# Patient Record
Sex: Female | Born: 1960 | Race: White | Hispanic: No | Marital: Married | Smoking: Never smoker
Health system: Southern US, Community
[De-identification: ages and names within clinical notes are randomized; demographics above are authoritative.]

## PROBLEM LIST (undated history)

## (undated) DIAGNOSIS — I00 Rheumatic fever without heart involvement: Secondary | ICD-10-CM

## (undated) HISTORY — PX: APPENDECTOMY: SHX54

## (undated) HISTORY — PX: TONSILLECTOMY: SUR1361

## (undated) HISTORY — PX: HAND SURGERY: SHX662

## (undated) HISTORY — DX: Rheumatic fever without heart involvement: I00

## (undated) HISTORY — PX: ABDOMINAL HYSTERECTOMY: SHX81

## (undated) HISTORY — PX: BACK SURGERY: SHX140

## (undated) HISTORY — PX: TUBAL LIGATION: SHX77

---

## 2006-10-22 ENCOUNTER — Other Ambulatory Visit: Admission: RE | Admit: 2006-10-22 | Discharge: 2006-10-22 | Payer: Self-pay | Admitting: Obstetrics and Gynecology

## 2007-11-29 ENCOUNTER — Other Ambulatory Visit: Admission: RE | Admit: 2007-11-29 | Discharge: 2007-11-29 | Payer: Self-pay | Admitting: Obstetrics and Gynecology

## 2007-12-05 ENCOUNTER — Ambulatory Visit (HOSPITAL_COMMUNITY): Admission: RE | Admit: 2007-12-05 | Discharge: 2007-12-05 | Payer: Self-pay | Admitting: Obstetrics and Gynecology

## 2007-12-26 ENCOUNTER — Ambulatory Visit (HOSPITAL_COMMUNITY): Admission: RE | Admit: 2007-12-26 | Discharge: 2007-12-26 | Payer: Self-pay | Admitting: Obstetrics and Gynecology

## 2007-12-26 ENCOUNTER — Encounter: Payer: Self-pay | Admitting: Obstetrics and Gynecology

## 2008-02-18 ENCOUNTER — Ambulatory Visit (HOSPITAL_COMMUNITY): Admission: RE | Admit: 2008-02-18 | Discharge: 2008-02-18 | Payer: Self-pay | Admitting: Obstetrics and Gynecology

## 2008-02-25 ENCOUNTER — Encounter: Payer: Self-pay | Admitting: Obstetrics and Gynecology

## 2008-02-25 ENCOUNTER — Ambulatory Visit (HOSPITAL_COMMUNITY): Admission: RE | Admit: 2008-02-25 | Discharge: 2008-02-27 | Payer: Self-pay | Admitting: Obstetrics and Gynecology

## 2008-11-10 ENCOUNTER — Ambulatory Visit: Payer: Self-pay | Admitting: Pain Medicine

## 2008-11-18 ENCOUNTER — Ambulatory Visit: Payer: Self-pay | Admitting: Pain Medicine

## 2008-12-22 ENCOUNTER — Ambulatory Visit: Payer: Self-pay | Admitting: Pain Medicine

## 2009-01-06 ENCOUNTER — Ambulatory Visit: Payer: Self-pay | Admitting: Pain Medicine

## 2010-01-02 IMAGING — US US TRANSVAGINAL NON-OB
1 series · 14 of 25 positions shown · non-contrast
Comparison: None

CLINICAL DATA: Endometriosis

TRANSABDOMINAL AND TRANSVAGINAL ULTRASOUND OF PELVIS
TECHNIQUE: Both transabdominal and transvaginal ultrasound
examinations of the pelvis were performed including evaluation of
the uterus, ovaries, adnexal regions, and pelvic cul-de-sac.

[Series 1: us transvaginal non-ob · 0.28mm/px · 14 of 32 slices shown]
[im 1/32]
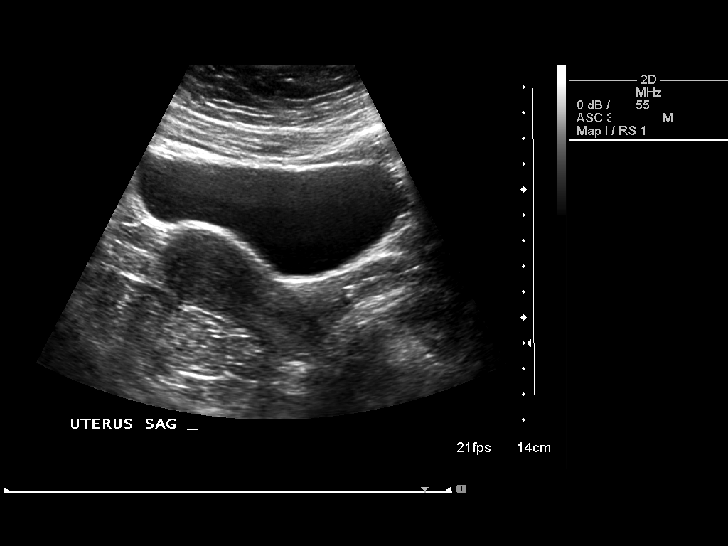
[im 3/32]
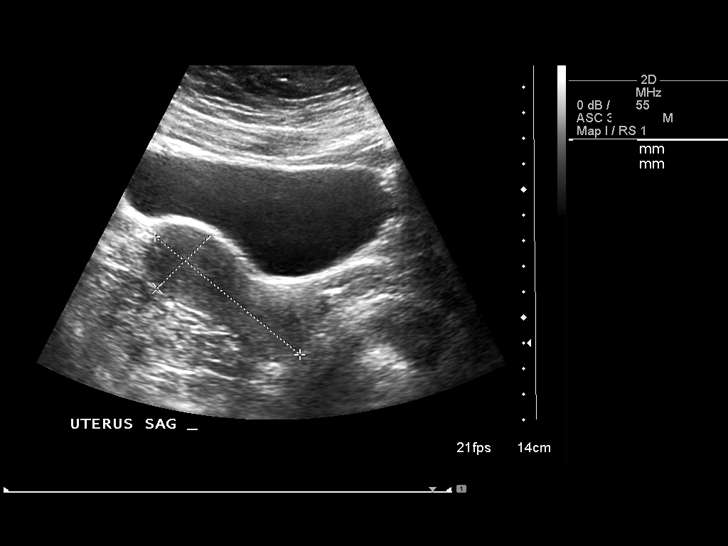
[im 6/32]
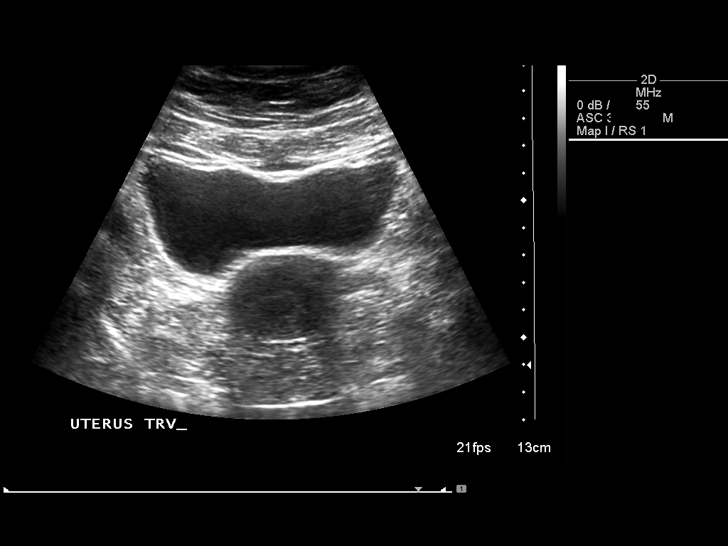
[im 8/32]
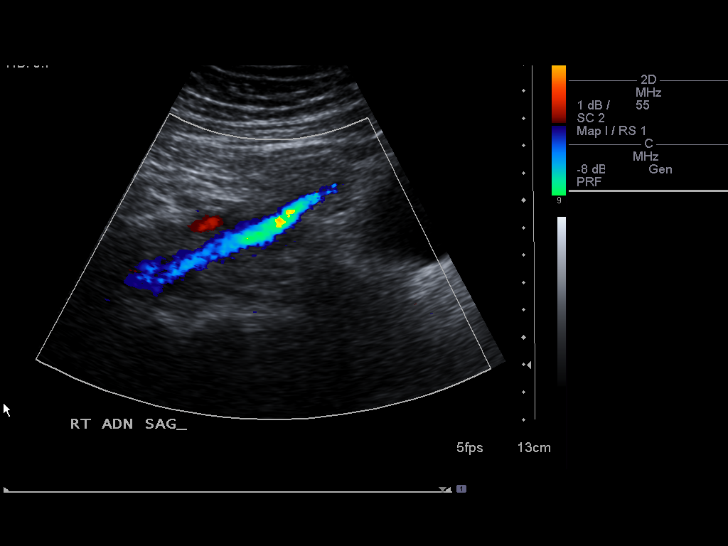
[im 11/32]
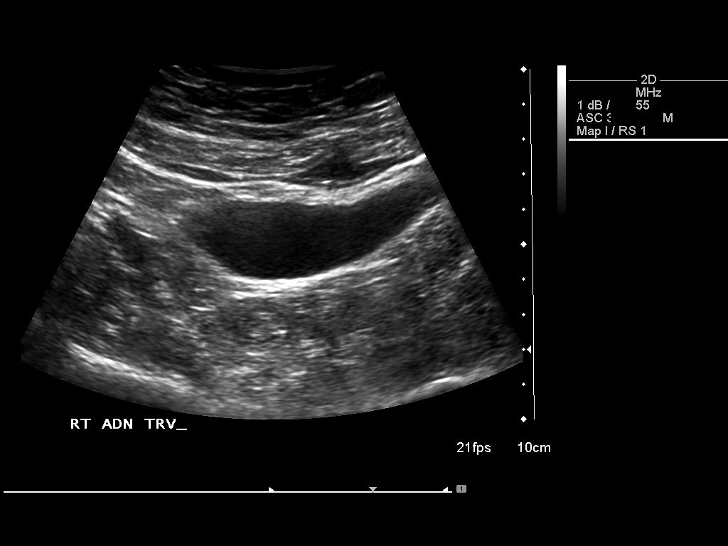
[im 12/32]
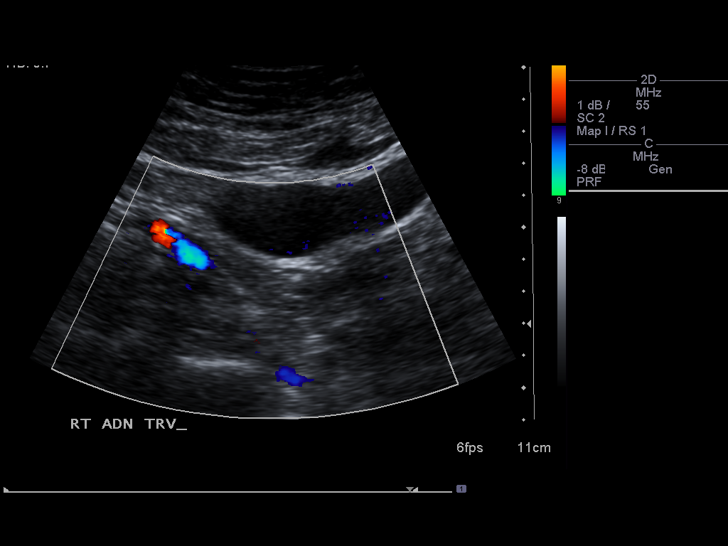
[im 15/32]
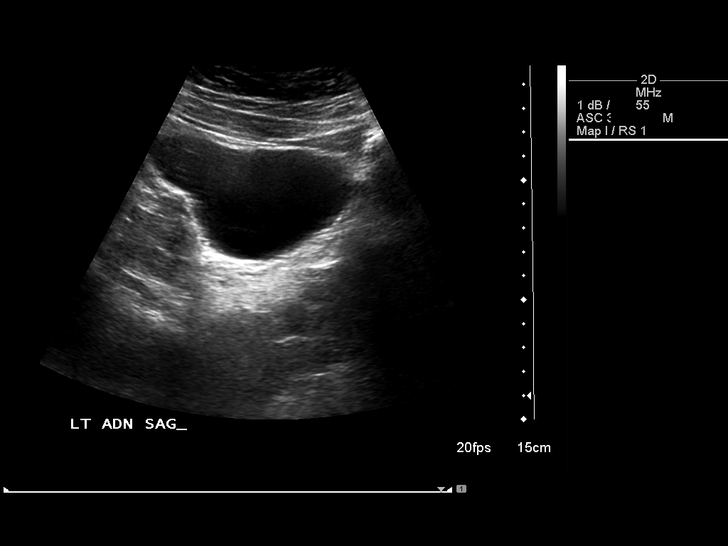
[im 17/32]
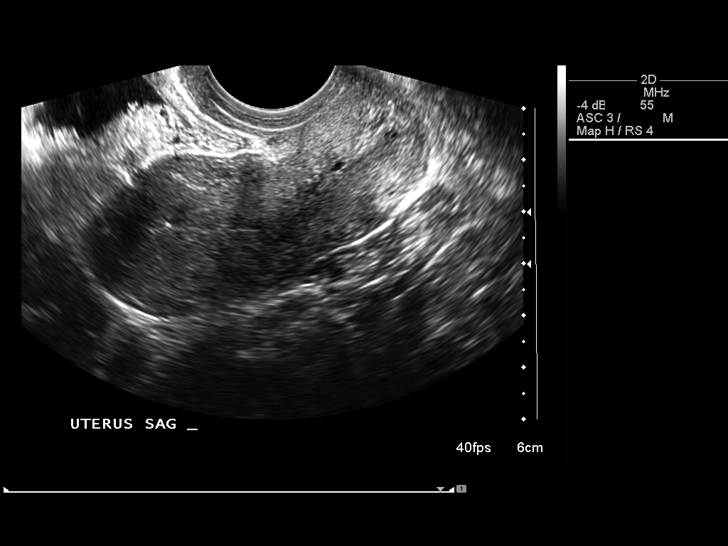
[im 20/32]
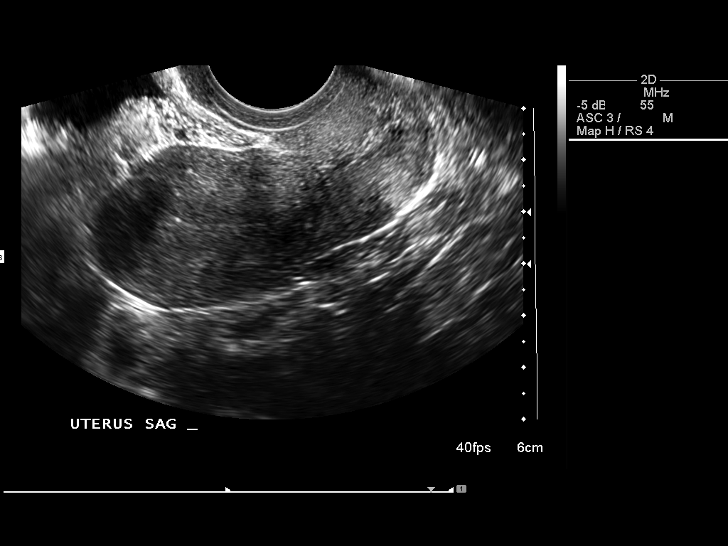
[im 21/32]
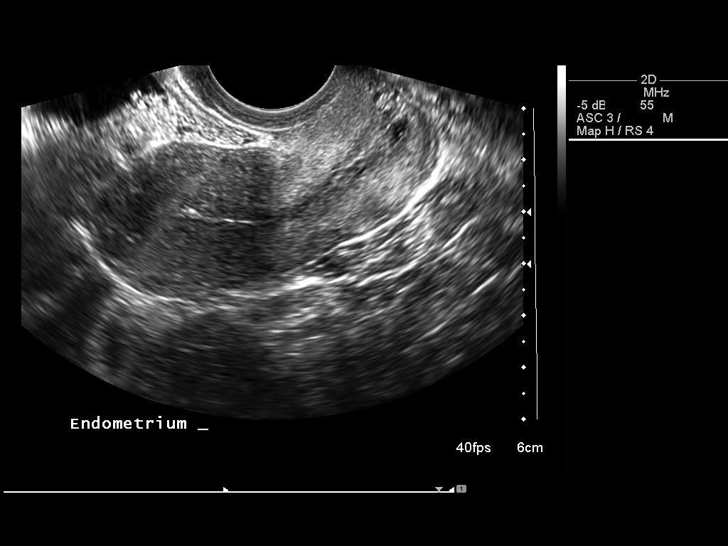
[im 24/32]
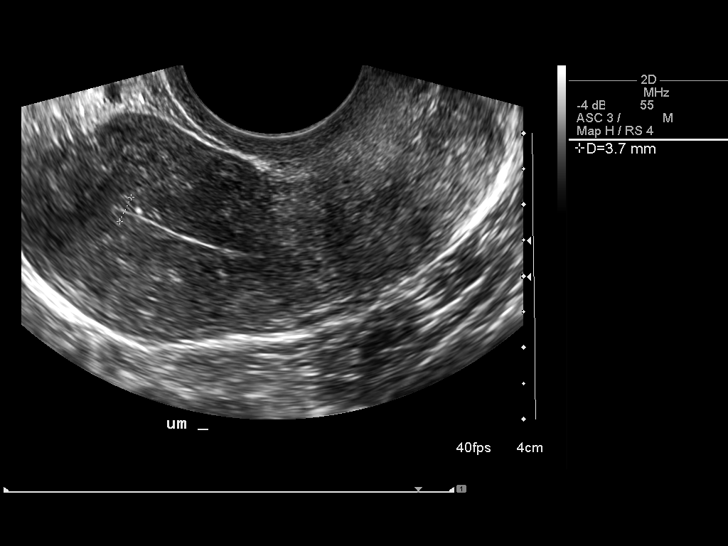
[im 26/32]
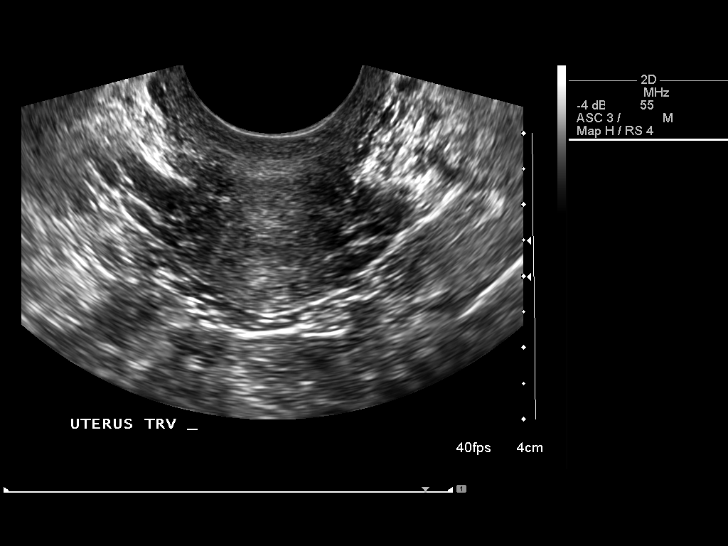
[im 29/32]
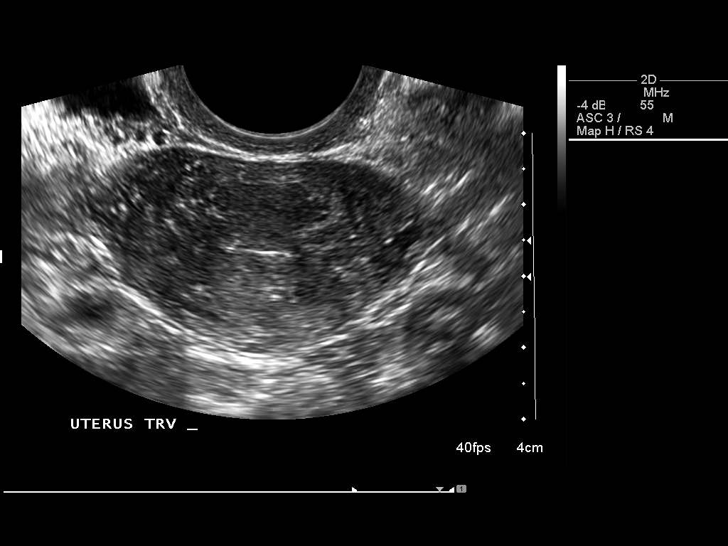
[im 32/32]
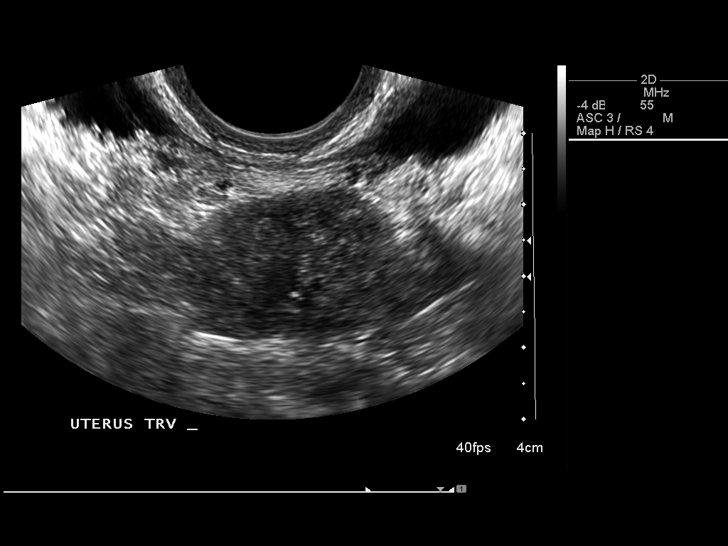

[14 of 25 positions shown; findings below may reference images not displayed]

FINDINGS: Uterus measures 7.3 cm length by 3.0 cm AP by 4.9 cm transverse.
Endometrial complex 4 mm thick, normal.
No uterine mass or endometrial fluid.
No free pelvic fluid.
Neither ovary is definitively identified on transabdominal or
endovaginal imaging.
No adnexal masses.
Visualized portion of urinary bladder unremarkable.
IMPRESSION: Nonvisualization of ovaries.
Unremarkable uterus.
No acute pelvic abnormality identified.

## 2010-05-09 LAB — COMPREHENSIVE METABOLIC PANEL
ALT: 30 U/L (ref 0–35)
AST: 28 U/L (ref 0–37)
Albumin: 3.7 g/dL (ref 3.5–5.2)
Calcium: 8.9 mg/dL (ref 8.4–10.5)
Glucose, Bld: 97 mg/dL (ref 70–99)
Total Protein: 5.9 g/dL — ABNORMAL LOW (ref 6.0–8.3)

## 2010-05-09 LAB — CBC
HCT: 37.8 % (ref 36.0–46.0)
MCHC: 34 g/dL (ref 30.0–36.0)
MCV: 90.7 fL (ref 78.0–100.0)
Platelets: 201 10*3/uL (ref 150–400)
RDW: 12.9 % (ref 11.5–15.5)
WBC: 4.5 10*3/uL (ref 4.0–10.5)

## 2010-05-09 LAB — URINALYSIS, ROUTINE W REFLEX MICROSCOPIC
Glucose, UA: NEGATIVE mg/dL
Hgb urine dipstick: NEGATIVE
Nitrite: NEGATIVE
pH: 6.5 (ref 5.0–8.0)

## 2010-05-09 LAB — HCG, QUANTITATIVE, PREGNANCY: hCG, Beta Chain, Quant, S: <2 m[IU]/mL (ref <5)

## 2010-05-09 LAB — TYPE AND SCREEN: ABO/RH(D): B POS

## 2010-05-09 LAB — URINE CULTURE: Colony Count: NO GROWTH

## 2010-05-10 LAB — DIFFERENTIAL
Basophils Absolute: 0 10*3/uL (ref 0.0–0.1)
Basophils Relative: 1 % (ref 0–1)
Eosinophils Absolute: 0.1 10*3/uL (ref 0.0–0.7)
Lymphocytes Relative: 38 % (ref 12–46)
Lymphs Abs: 3 10*3/uL (ref 0.7–4.0)
Neutro Abs: 3.8 10*3/uL (ref 1.7–7.7)

## 2010-05-10 LAB — CBC
HCT: 33.5 % — ABNORMAL LOW (ref 36.0–46.0)
Platelets: 169 10*3/uL (ref 150–400)
RBC: 3.71 MIL/uL — ABNORMAL LOW (ref 3.87–5.11)
RDW: 12.8 % (ref 11.5–15.5)

## 2010-06-07 NOTE — Op Note (Signed)
NAME:  Kaylee [name], Kaylee [name]                 ACCOUNT NO.:  [id number]   MEDICAL RECORD NO.:  [id number]          PATIENT TYPE:  OIB   LOCATION:  A305                          FACILITY:  [facility]   PHYSICIAN:  Tilda Burrow, M.D. DATE OF BIRTH:  [birth year]   DATE OF PROCEDURE:  DATE OF DISCHARGE:  02/27/2008                               OPERATIVE REPORT   PREOPERATIVE DIAGNOSIS:  Chronic pelvic pain and adenomyosis status post  failed endometrial ablation, fibromyalgia.   POSTOPERATIVE DIAGNOSIS:  Chronic pelvic pain and adenomyosis status  post failed endometrial ablation, fibromyalgia.   PROCEDURE:  Laparoscopically assisted vaginal hysterectomy.   SURGEON:  Tilda Burrow, MD   ASSISTANTAmie Critchley, CST   Anesth:General.   COMPLICATIONS:  None.   FINDINGS:  Mobile uterus, normal size, normal appearing ovaries,  bilateral evidence of ovulation stigma along the left ovary.   DETAILS OF PROCEDURE:  The patient was taken to the operating room,  prepped and draped for combined abdominal and vaginal procedure.  Time-  out was conducted.  Procedure confirmed and IV antibiotics infused.  Cervix grasped with a single-tooth tenaculum, Hulka tenaculum attached  to the cervix for uterine manipulation and Foley catheter inserted.  Latex free instruments and clothes were used.  Abdomen was then dressed  by placing an infraumbilical vertical 1 cm skin incision made as well as  a transverse suprapubic incision with Veress needle used to enter the  peritoneal cavity.  Water droplet test confirmed intraperitoneal  positioning and pneumoperitoneum was achieved under 7 mmHg pressure.  The 10-mm laparoscopic trocar was introduced via the umbilicus without  difficulty and the abdomen scanned.  No evidence of trauma or bleeding  was identified.  Attention was then directed to the pelvis.  The  suprapubic trocar was introduced under direct visualization.  There was  some omental adhesions in the right  side of the abdomen on the lateral  abdominal side wall.  No bowel adhesions were attached in this field.  Right-sided trocar was placed equal to the umbilicus, lateral to the  rectus muscles and did not involve the adhesions.  Attention was then  directed to uterus, which was easily identified and well visualized.  Bowel was moved away.  Round ligaments were taken down on either side  using the Harmonic scalpel.  The utero-ovarian ligament and fallopian  tube were then taken down on either side using Harmonic scalpel.  The  bladder flap was developed anteriorly.  Uterine vessels were  skeletonized sufficiently on either that they were then coagulated on  either side using the Harmonic scalpel with several coagulating  graspings on either side before transecting the uterine vessels.  There  was a small amount of oozing on the left side that was treated with  individual coagulation.  Hemostasis was excellent at this time.  The  laparoscopic instruments were removed and abdomen deflated and trocar  sleeves were left in place while we went to the pelvis.  Vaginal  hysterectomy portion was then performed.  With the operator sitting  between the patient's legs which were raised in high  lithotomy position  in the candy cane stirrups, the cervix was circumscribed with Bovie  cautery after infiltration with 8 mL of Marcaine solution to assist with  dissection.  Posterior colpotomy incision was performed without  difficulty.  Uterosacral ligaments were grasped, transected, and then  ligated with 0 chromic suture which was tagged.  Lower cardinal  ligaments were clamped, cut, and suture ligated on either side.  The  bladder flap liberation was completed anteriorly and bladder could be  pulled away.  Upper cardinal ligaments were then inferiorly clamped,  cut, and suture ligated on either side.  Then the small remnant of  pedicle on either side was then clamped, cut, and suture ligated in the  lower  most portions of the broad ligament.  Uterus was easily removed  and hemostasis was excellent.  The pedicles were inspected and confirmed  as hemostatic.  The uterosacral ligaments were quite far apart and 2-0  Prolene suture was then used to sew between the inner aspects of the  uterosacral ligament on either side and tied loosely to keep the  uterosacral ligaments in natural anatomic relationship and reduce  potential for future herniation.  Peritoneum was then closed from the  bladder flap to the posterior colpotomy incision site.  The anterior  vaginal cuff was then closed with a series of running 2-0 chromic  sutures.  Vaginal packing was placed and procedure considered complete.  The sponge and needle counts were correct.  The patient tolerated well.  The patient was then awakened and went to recovery room in stable  condition.   It should be mentioned that after completion of the vaginal procedure,  we moved back up to the abdomen and inspected the pedicles from above  and confirmed hemostasis, took some photos to document that everything  was satisfactory at the completion of the case and deflated the abdomen  once again, removed laparoscopic instruments, closed the umbilical cord  with 0 Vicryl at the fascia level and then subcuticular 4-0 Dexon at  both the right-sided site, suprapubic site, and umbilical site.  Steri-  Strips were placed and the patient went to recovery room in stable  condition.      Tilda Burrow, M.D.  Electronically Signed     JVF/MEDQ  D:  02/27/2008  T:  02/28/2008  Job:  [id number]

## 2010-06-07 NOTE — H&P (Signed)
NAME:  Kaylee [name], DRY                 ACCOUNT NO.:  [id number]   MEDICAL RECORD NO.:  [id number]          PATIENT TYPE:  AMB   LOCATION:  DAY                           FACILITY:  [facility]   PHYSICIAN:  Tilda Burrow, M.D. DATE OF BIRTH:  11/16/1960   DATE OF ADMISSION:  DATE OF DISCHARGE:  LH                              HISTORY & PHYSICAL   ADMITTING DIAGNOSES:  1. Dysmenorrhea.  2. Breakthrough bleeding on Lybrel.  3. Fibromyalgia.  4. Desire for elective permanent sterilization.   HISTORY OF PRESENT ILLNESS:  This 50 year old permanently disabled  female is admitted at this time for hysteroscopy, D&C, endometrial  ablation as well as laparoscopic tubal sterilization.  Kaylee [name] has been  seen in our office this year and has been evaluated for her debilitating  discomfort.  She has been placed on continuous Lybrel 90 mcg, estrogen  containing birth control pill used on a continuous method to avoid the  debilitating dysmenorrhea.  Unfortunately, she is frustrated by the  generalized malaise and sense of bloating all the time that she  associates being on the Lybrel continuously.  She has a daily sensation  of PMS with bloating abdominal ache and continuous headache while on the  Lybrel, this goes away if she is off the hormones, but the periods  become debilitating.  Pain threshold is affected by the fibromyalgia.  The patient had options discussed and has had evaluation including  ultrasound which showed a 7.3 cm long by 3 x 4.9 cm uterus with a thin 4-  mm endometrial thickness considered normal Pap smears class I.  There  are no adnexal masses.   Recent laboratory data includes hemoglobin 13, hematocrit 40, and white  count 4900.  BUN 10, creatinine 0.79.  Cholesterol level shows total  cholesterol 164, triglycerides 78, HDL 62, LDL 86.  Vitamin D levels are  actually 39 mg/mL which is quite good.   PAST MEDICAL HISTORY:  Positive for fibromyalgia with a history of  rheumatic  fever.   SURGICAL HISTORY:  [place], [place], appendectomy 1972, tonsillectomy  1980, surgery on left and right arm and hand 1998 as well as 1999 and  2000.   Allergies include PENICILLIN, SULFA DRUGS, KIDNEY DYE.   MEDICATIONS:  Lybrel 20 mcg, Topamax 100 mg p.o. as needed for  headaches, Lortab for pain management.   PHYSICAL EXAMINATION:  VITAL SIGNS:  Height 5 feet 6 inches, weight  202.4, blood pressure 120/80, pulse 70.  HEENT:  Pupils equal, round, and reactive.  Extraocular movements  intact.  NECK:  Supple.  CHEST:  Clear to auscultation.  ABDOMEN:  No gross abnormalities noted.  Bowel sounds active.  Lower  abdomen soft.  EXTERNAL GENITALIA:  Multiparous.  Uterus anteflexed.  Normal size,  shape, and contour.  Adnexa nontender without masses.   ASSESSMENT:  Dysmenorrhea, menorrhagia.   PLAN:  LVS, Falope rings as well as hysteroscopy, D&C, endometrial  ablation on December 26, 2007.      Tilda Burrow, M.D.  Electronically Signed     JVF/MEDQ  D:  12/23/2007  T:  12/24/2007  Job:  [id number]   cc:   [facility]

## 2010-06-07 NOTE — Discharge Summary (Signed)
[name], [name]                 ACCOUNT NO.:  [id number]   MEDICAL RECORD NO.:  [id number]          PATIENT TYPE:  OIB   LOCATION:  A305                          FACILITY:  [facility]   PHYSICIAN:  Tilda Burrow, M.D. DATE OF BIRTH:  1960/10/28   DATE OF ADMISSION:  02/25/2008  DATE OF DISCHARGE:  02/04/2010LH                               DISCHARGE SUMMARY   ADMITTING DIAGNOSES:  1. Chronic pelvic pain.  2. Adenomyosis.  3. Status post endometrial ablation.  4. Fibromyalgia.   DISCHARGE DIAGNOSES:  1. Chronic pelvic pain.  2. Adenomyosis.  3. Status post endometrial ablation.  4. Fibromyalgia.   PROCEDURE:  Laparoscopically-assisted vaginal hysterectomy by Tilda Burrow.   DISCHARGE MEDICATIONS:  1. Percocet 5/325, #60 one p.o. q.6 h. p.r.n. pain x2 weeks.  2. The stool softener of choice p.o. daily.   ADDITIONAL MEDICATIONS:  1. Topamax 50 mg p.o. nightly.  2. Cipro 500 p.o. twice daily to complete urinary tract infection      treatment.   HOSPITAL SUMMARY:  This is a 50 year old permanently disabled female  with fibromyalgia, is 2 months status post hysteroscopy, D and C,  endometrial ablation, was scheduled for LAVH.  This was performed  without complications on February 25, 2008 and was uneventful.  The  patient's postoperative pain management was managed  successfully with Buprenex, which gave her an excellent relief (narcotic  agonist/antagonist).  This worked more effectively than Toradol or  opioids.  The patient was stable for discharge on 2 days instead the  usual 1 day, due to her pain intolerances and will be discharged home  for routine postop care and follow up in 2 weeks.      Tilda Burrow, M.D.  Electronically Signed     JVF/MEDQ  D:  02/27/2008  T:  02/28/2008  Job:  [id number]   cc:   Dr. Gavin Pound, [place], [place]
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
## 2010-06-07 NOTE — Op Note (Signed)
NAME:  Kaylee [name], Kaylee [name]                 ACCOUNT NO.:  [id number]   MEDICAL RECORD NO.:  [id number]          PATIENT TYPE:  AMB   LOCATION:  DAY                           FACILITY:  [facility]   PHYSICIAN:  Tilda Burrow, M.D. DATE OF BIRTH:  11-23-1960   DATE OF PROCEDURE:  12/26/2007  DATE OF DISCHARGE:  12/26/2007                               OPERATIVE REPORT   PREOPERATIVE DIAGNOSES:  Dysmenorrhea, desired for electrosterilization,  fibromyalgia with severe pain with menses.   POSTOPERATIVE DIAGNOSES:  Dysmenorrhea, desired for  electrosterilization, fibromyalgia with severe pain with menses.   PROCEDURE:  1 . Hysteroscopy, dilatation and curettage, endometrial  ablation,  1. laparoscopic tubal sterilization, and fallopian rings.   INDICATIONS:  A 50 year old disabled female with incapacitating pain  over periods with schedule for hysteroscopy, D&C, and endometrial  ablation with plan for permanent sterilization, same time.  She had been  on Lybrel continuous OCP, but has side-effects and cannot tolerate them.   DETAILS OF THE PROCEDURE:  The patient was taken to the operating room  prepped and draped for a combined abdominal and vaginal procedure with  cervix grasped with a single-tooth tenaculum, in and out catheterization  of the bladder performed and attention was directed to the abdomen.  Time out had been conducted and procedure confirmed by all parties.  An  infraumbilical vertical 1-cm skin incision is made as well as a  transverse suprapubic incision.  Veress needle was introduced through  the umbilicus being carefully oriented toward the pelvis while elevating  the abdominal wall.  Water droplet test confirmed intraperitoneal  positioning.  The pneumoperitoneum was performed under normal pressures  and laparoscopic visualization of the umbilical 5-mm trocar incision was  performed.  Inspection of the abdomen showed no evidence of trauma or  bleeding associated with  insertion technique and a dense suprapubic  trocar of 8 mm was placed without difficulty under direct visualization.  Attention was directed to the pelvis and each fallopian tube was  identified to its fimbriated end, elevated, and the mid segment  microtube incarcerated in a Falope ring and the incarcerated microtube  infiltrated with dilute Marcaine solution by percutaneous spinal needle  placement.  Both the mesosalpinx was infiltrated with Marcaine solution  as well.  Cul-de-sac was inspected.  But there were no GYN pathology and  pelvis showed good support.  Ovaries were normal and mobile.  Attention  was directed to the deflation of the abdomen with 120 mL of saline  placed in the abdomen to help with removal of CO2 and abdomen was  deflated.  Laparoscopic instruments removed.  Trocars were removed and  the incisions closed subcuticular with 4-0 Dexon.  Steri-Strips were  placed and umbilical portion of the procedure considered complete.   Endometrial ablation followed.  After repositioning at the vaginal  entrance, speculum was inserted.  Cervix was grasped with a single-tooth  tenaculum was sounded to 7 cm, dilated to 23-French, allowing the  introduction of a 30-degree rigid hysteroscope which showed a thinned  endometrial cavity with both tubal ostia identified.  The Gynecare  Thermachoice III endometrial ablation device was then prepped and tested  and inserted into the endometrial cavity, filled with 9 mL of D5W and  thermal 8 minute ablation sequence initiated.  The uterus have the  tendency relax but maintain pressure at just above 100 mmHg which will  add completion of the 8 minute time  sequence.  All 9 mL fluid was obtained back and was accounted for at the  end of the end of the procedure.  Paracervical Marcaine solution was  injected x10 mL on each side to complete effects of analgesia.  The  patient went to the recovery room in stable conditions.  Sponge, lap,  and  needle counts were correct.      Tilda Burrow, M.D.  Electronically Signed     JVF/MEDQ  D:  12/27/2007  T:  12/28/2007  Job:  [id number]

## 2010-10-28 LAB — CBC
HCT: 39.5 % (ref 36.0–46.0)
Hemoglobin: 13.3 g/dL (ref 12.0–15.0)
MCV: 91.3 fL (ref 78.0–100.0)
RBC: 4.32 MIL/uL (ref 3.87–5.11)
WBC: 6.3 10*3/uL (ref 4.0–10.5)

## 2013-01-23 HISTORY — PX: COLONOSCOPY: SHX174

## 2014-11-30 ENCOUNTER — Encounter: Payer: Self-pay | Admitting: Gastroenterology

## 2014-12-11 DIAGNOSIS — R002 Palpitations: Secondary | ICD-10-CM

## 2014-12-11 DIAGNOSIS — J301 Allergic rhinitis due to pollen: Secondary | ICD-10-CM | POA: Insufficient documentation

## 2014-12-11 DIAGNOSIS — I00 Rheumatic fever without heart involvement: Secondary | ICD-10-CM

## 2014-12-11 DIAGNOSIS — B019 Varicella without complication: Secondary | ICD-10-CM

## 2014-12-14 ENCOUNTER — Ambulatory Visit (HOSPITAL_COMMUNITY)
Admission: RE | Admit: 2014-12-14 | Discharge: 2014-12-14 | Disposition: A | Discharge status: Home / Self Care | Payer: Medicare HMO | Source: Ambulatory Visit | Attending: Internal Medicine | Admitting: Internal Medicine

## 2014-12-14 ENCOUNTER — Encounter: Payer: Self-pay | Admitting: Internal Medicine

## 2014-12-14 ENCOUNTER — Ambulatory Visit (INDEPENDENT_AMBULATORY_CARE_PROVIDER_SITE_OTHER): Payer: Medicare HMO | Admitting: Internal Medicine

## 2014-12-14 VITALS — BP 116/74 | HR 65 | Ht 67.0 in | Wt 209.0 lb

## 2014-12-14 DIAGNOSIS — I351 Nonrheumatic aortic (valve) insufficiency: Secondary | ICD-10-CM | POA: Diagnosis not present

## 2014-12-14 DIAGNOSIS — Z136 Encounter for screening for cardiovascular disorders: Secondary | ICD-10-CM | POA: Diagnosis not present

## 2014-12-14 DIAGNOSIS — R001 Bradycardia, unspecified: Secondary | ICD-10-CM

## 2014-12-14 DIAGNOSIS — R002 Palpitations: Secondary | ICD-10-CM

## 2014-12-14 NOTE — Progress Notes (Signed)
Cardiology Office Note   Date:  12/14/2014   ID:  Kaylee, [name] 03/22/1960, MRN [id number]  PCP:  [facility]  Cardiologist:   Dorris Carnes, MD   No chief complaint on file.  Referred for eval of palpitations      History of Present Illness: Kaylee [name] is a 54 y.o. female with a history of palpitations.  Has hasd since 08-May-2012 Daughter died of overdose at that time   Pt says that she has a heart murmur with leaking valve  Echo remote  Breathing OK  Has heart wiggling  At night  Lots of nights  Increased with fatigue and stress  Lasts seconds to hours  No dizziness   Now has punching sensation in chest  Not often  Lasts seconds    Pt active   Goes to [employer] water 3x per wk  [place] small dog .  No CP       Current Outpatient Prescriptions  Medication Sig Dispense Refill  . cetirizine (ZYRTEC) 5 MG tablet Take by mouth.    Marland Kitchen HYDROcodone-acetaminophen (NORCO) 10-325 MG tablet Take 1 tablet by mouth.    . Melatonin (MELATONIN MAXIMUM STRENGTH) 5 MG TABS Take by mouth.    . Multiple Vitamins-Minerals (PRESERVISION AREDS) CAPS Take 1 capsule by mouth 2 (two) times daily.    . Omega-3 Fatty Acids (FISH OIL) 1000 MG CAPS Take 1,000 mg by mouth 3 (three) times daily.     No current facility-administered medications for this visit.    Allergies:   Penicillins; Sulfa antibiotics; and Iodine   PMH  Rheumatic fever.    PSH  Appy.  Tonsillectomy.  Hand surgery  Arm surgery.  Back surgery     Social History:  The patient  reports that she has never smoked. She does not have any smokeless tobacco history on file.   Family History:  The patient's family history is not on file.    ROS:  Please see the history of present illness. All other systems are reviewed and  Negative to the above problem except as noted.    PHYSICAL EXAM: VS:  BP 116/74 mmHg  Pulse 65  Ht 5\' 7"  (1.702 m)  Wt 94.802 kg (209 lb)  BMI 32.73 kg/m2  SpO2 97%  GEN: Well  nourished, well developed, in no acute distress HEENT: normal Neck: no JVD, carotid bruits, or masses Cardiac: RRR; no murmurs, rubs, or gallops,no edema  Respiratory:  clear to auscultation bilaterally, normal work of breathing GI: soft, nontender, nondistended, + BS  No hepatomegaly  MS: no deformity Moving all extremities   Skin: warm and dry, no rash Neuro:  Strength and sensation are intact Psych: euthymic mood, full affect   EKG:  EKG is ordered today.  Sinus bradycardia  53 bpm     Lipid Panel No results found for: CHOL, TRIG, HDL, CHOLHDL, VLDL, LDLCALC, LDLDIRECT    Wt Readings from Last 3 Encounters:  12/14/14 94.802 kg (209 lb)      ASSESSMENT AND PLAN:  1.  Palpitations  Pt has multiple sensations  None sound hemodynamically destabilizing  Active   I would recomm echo to evaluate LV function    2.  Hx murmur  None on exam  Get echo to      Signed, Dorris Carnes, MD  12/14/2014 2:02 PM    [address], [place], [place]  [address] Phone: [phone])  [phone]; Fax: [phone]

## 2014-12-14 NOTE — Progress Notes (Signed)
*  PRELIMINARY RESULTS* Echocardiogram 2D Echocardiogram has been performed.  Leavy Cella 12/14/2014, 3:46 PM

## 2014-12-14 NOTE — Patient Instructions (Signed)
Your physician recommends that you schedule a follow-up appointment  As needed  Your physician has requested that you have an echocardiogram. Echocardiography is a painless test that uses sound waves to create images of your heart. It provides your doctor with information about the size and shape of your heart and how well your heart's chambers and valves are working. This procedure takes approximately one hour. There are no restrictions for this procedure.   Your physician recommends that you continue on your current medications as directed. Please refer to the Current Medication list given to you today.  If you need a refill on your cardiac medications before your next appointment, please call your pharmacy.  Thank you for choosing [facility]!

## 2014-12-16 ENCOUNTER — Encounter: Payer: Self-pay | Admitting: Internal Medicine

## 2014-12-16 ENCOUNTER — Telehealth: Payer: Self-pay | Admitting: *Deleted

## 2014-12-16 NOTE — Telephone Encounter (Signed)
Called Pt to inform of event monitor. Pt states that when she spoke with Dr. Harrington Challenger it was decided that she would wait until after seeing GI before doing event monitor. Pt states that she will call us after her GI visit.

## 2014-12-16 NOTE — Telephone Encounter (Signed)
-----   Message from Bernita Raisin, RN sent at 12/16/2014 10:54 AM EST ----- Dr Harrington Challenger  ----- Message -----    From: Fay Records, MD    Sent: 12/16/2014  10:28 AM      To: Bernita Raisin, RN  Called pt re echo.  OK   Mild Still having palpitations mainly at night  Also having intermitt punching sensation in chest   Had one right after echo exam Will set up for event monitor

## 2014-12-23 ENCOUNTER — Ambulatory Visit: Payer: Medicare HMO | Admitting: Gastroenterology

## 2014-12-28 ENCOUNTER — Ambulatory Visit (INDEPENDENT_AMBULATORY_CARE_PROVIDER_SITE_OTHER): Payer: Medicare HMO | Admitting: Gastroenterology

## 2014-12-28 ENCOUNTER — Encounter: Payer: Self-pay | Admitting: Gastroenterology

## 2014-12-28 VITALS — BP 119/78 | HR 63 | Temp 97.2°F | Ht 67.0 in | Wt 208.4 lb

## 2014-12-28 DIAGNOSIS — K5900 Constipation, unspecified: Secondary | ICD-10-CM | POA: Insufficient documentation

## 2014-12-28 DIAGNOSIS — R198 Other specified symptoms and signs involving the digestive system and abdomen: Secondary | ICD-10-CM | POA: Insufficient documentation

## 2014-12-28 DIAGNOSIS — R1011 Right upper quadrant pain: Secondary | ICD-10-CM | POA: Diagnosis not present

## 2014-12-28 LAB — COMPREHENSIVE METABOLIC PANEL
ALBUMIN: 4.5 g/dL (ref 3.6–5.1)
ALK PHOS: 73 U/L (ref 33–130)
ALT: 22 U/L (ref 6–29)
AST: 22 U/L (ref 10–35)
BILIRUBIN TOTAL: 0.5 mg/dL (ref 0.2–1.2)
BUN: 7 mg/dL (ref 7–25)
CO2: 30 mmol/L (ref 20–31)
CREATININE: 0.61 mg/dL (ref 0.50–1.05)
Calcium: 9.3 mg/dL (ref 8.6–10.4)
Chloride: 101 mmol/L (ref 98–110)
Glucose, Bld: 94 mg/dL (ref 65–99)
Potassium: 4.8 mmol/L (ref 3.5–5.3)
SODIUM: 139 mmol/L (ref 135–146)
TOTAL PROTEIN: 6.8 g/dL (ref 6.1–8.1)

## 2014-12-28 LAB — LIPASE: LIPASE: 23 U/L (ref 7–60)

## 2014-12-28 NOTE — Assessment & Plan Note (Signed)
54 y/o female with recent acute on chronic right upper quadrant discomfort associated with certain foods but no nausea or vomiting. Over the past week symptoms have been improved with dietary changes. On exam she has notable tenderness along the right lower rib cage margin which she describes as her chronic pain but clearly has some symptoms more related to food intake. She describes having a negative gallbladder workup. She denies typical heartburn symptoms. Denies NSAID use. Increased symptoms with bread intake. Bowel habits irregular. Will screen for celiac disease, need to consider musculoskeletal pain as contributor to her symptoms, cannot exclude gastritis/duodenitis.  Initially would like to obtain copy of her ultrasound and/or HIDA results, colonoscopy report. We will obtain some basic labs along with screening for celiac. Patient hesitant to starting medication without workup. She will only need trial of NSAIDs for musculoskeletal pain. Further recommendations to follow.

## 2014-12-28 NOTE — Progress Notes (Signed)
cc'ed to pcp °

## 2014-12-28 NOTE — Patient Instructions (Signed)
1. Please have your labs done.  2. We will request copy of your records. 3. Further recommendations to follow.
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
## 2014-12-29 LAB — TISSUE TRANSGLUTAMINASE, IGA: TISSUE TRANSGLUTAMINASE AB, IGA: 1 U/mL (ref <4)

## 2014-12-30 ENCOUNTER — Encounter: Payer: Self-pay | Admitting: Gastroenterology

## 2014-12-30 NOTE — Progress Notes (Addendum)
Reviewed records. August 2015 colonoscopy by Dr. Barnabas Lister Spainhour, propofol, normal colonoscopy.   HIDA scan August 2015 normal with gallbladder ejection fraction of 59%.

## 2014-12-30 NOTE — Progress Notes (Signed)
Quick Note:  Please let patient know her labs are normal (LFTs, negative celiac screen, pancreas). I reviewed her records from [facility].  I would recommend EGD to further evaluate her ruq pain that is associated with meals. If she is agreeable, please schedule. Has she ever had difficult staying asleep for her colonoscopy, etc? If not, phenergan 25mg  IV 30 minutes before procedure to augment conscious sedation. ______

## 2015-01-01 ENCOUNTER — Telehealth: Payer: Self-pay | Admitting: *Deleted

## 2015-01-01 NOTE — Telephone Encounter (Signed)
Spoke with patient regarding event monitor. Pt states she does not want to do the event monitor. Pt states that GI is running a lot of test on her and she will call us if she changes her mind.

## 2015-01-04 ENCOUNTER — Ambulatory Visit: Payer: Medicare HMO | Admitting: Cardiovascular Disease

## 2015-01-07 ENCOUNTER — Encounter: Payer: Self-pay | Admitting: Internal Medicine

## 2015-01-13 NOTE — Progress Notes (Signed)
Quick Note:    Noted    ______

## 2015-10-04 ENCOUNTER — Other Ambulatory Visit (HOSPITAL_COMMUNITY): Payer: Self-pay | Admitting: Emergency Medicine

## 2015-10-04 DIAGNOSIS — N3941 Urge incontinence: Secondary | ICD-10-CM

## 2015-10-04 DIAGNOSIS — N8110 Cystocele, unspecified: Secondary | ICD-10-CM

## 2015-10-08 ENCOUNTER — Ambulatory Visit (HOSPITAL_COMMUNITY)
Admission: RE | Admit: 2015-10-08 | Discharge: 2015-10-08 | Disposition: A | Discharge status: Home / Self Care | Payer: Medicare HMO | Source: Ambulatory Visit | Attending: Emergency Medicine | Admitting: Emergency Medicine

## 2015-10-08 DIAGNOSIS — N8110 Cystocele, unspecified: Secondary | ICD-10-CM | POA: Diagnosis present

## 2015-10-08 DIAGNOSIS — N3941 Urge incontinence: Secondary | ICD-10-CM | POA: Diagnosis not present

## 2017-11-05 IMAGING — US US PELVIS LIMITED
1 series · 14 of 18 positions shown · non-contrast
Comparison: None.

CLINICAL DATA: Urge incontinence, bladder prolapse in female

EXAM:
LIMITED ULTRASOUND OF PELVIS
TECHNIQUE: Limited transabdominal ultrasound examination of the pelvis was
performed to assess the urinary bladder.

[Series 1: us pelvis limited · 0.24mm/px · 14 of 18 slices shown]
[im 1/18]
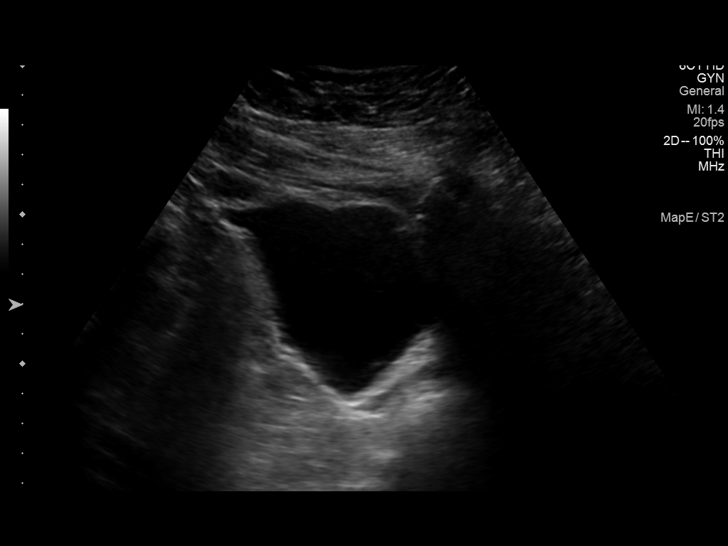
[im 2/18]
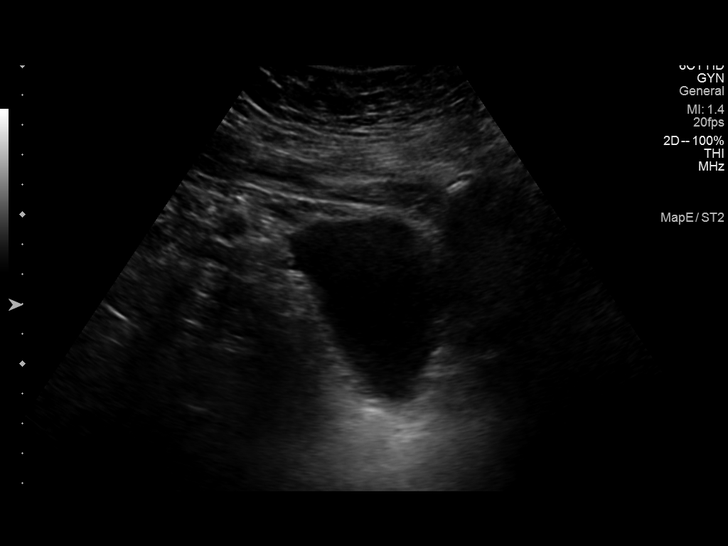
[im 4/18]
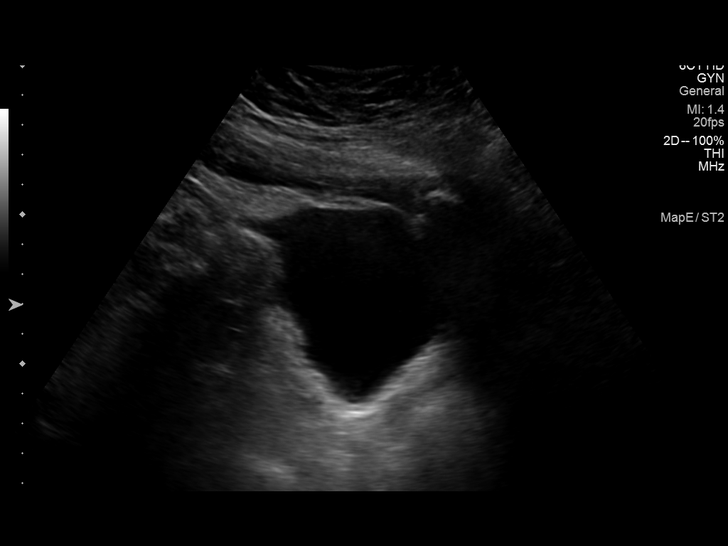
[im 5/18]
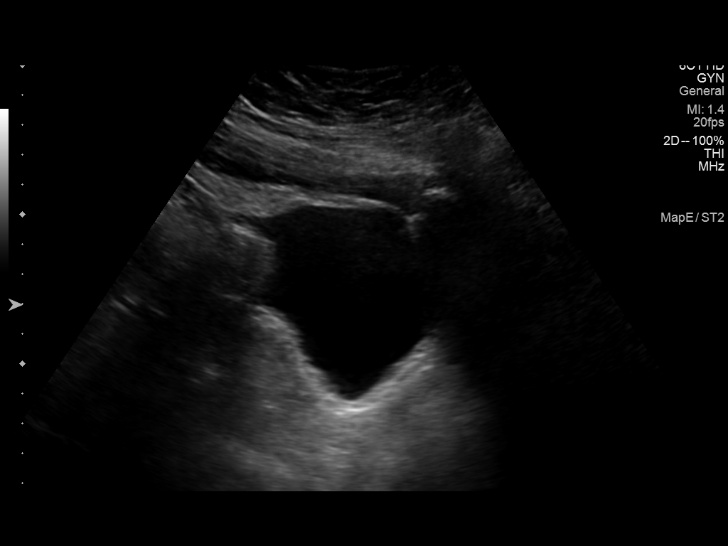
[im 6/18]
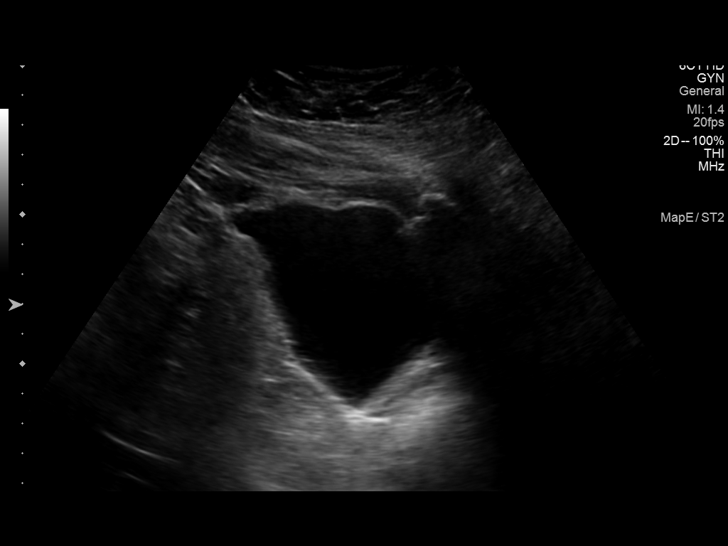
[im 8/18]
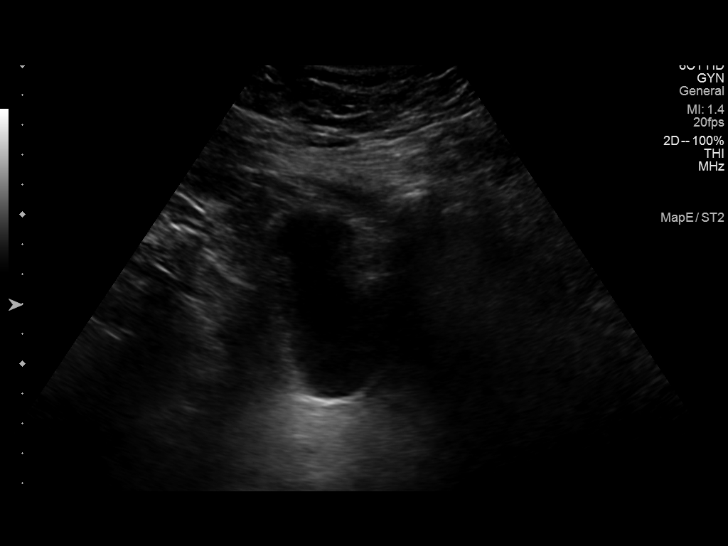
[im 9/18]
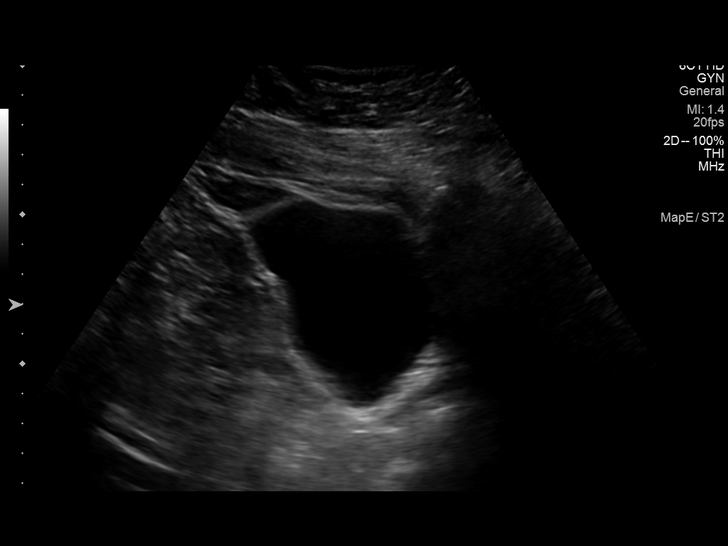
[im 10/18]
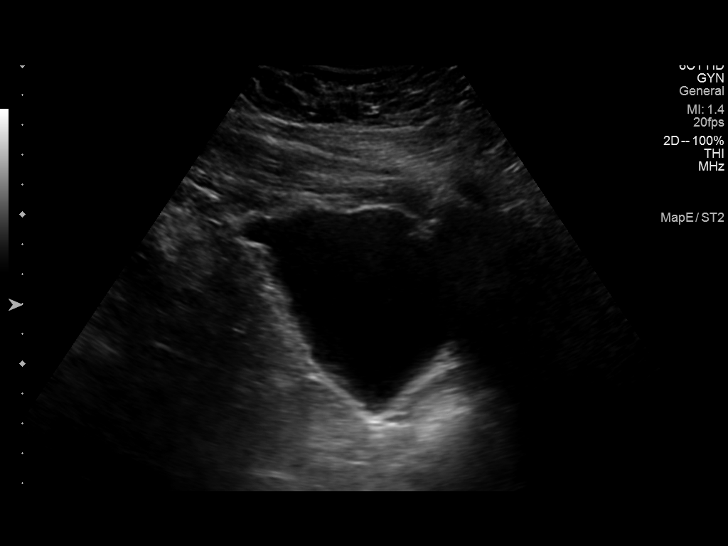
[im 11/18]
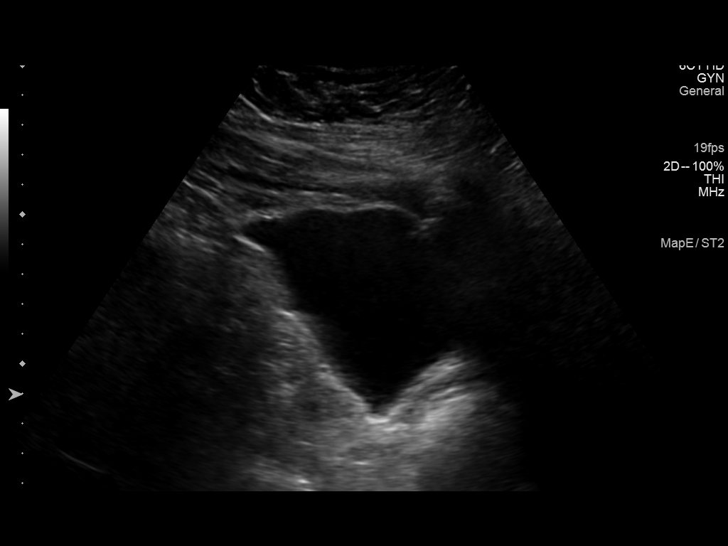
[im 13/18]
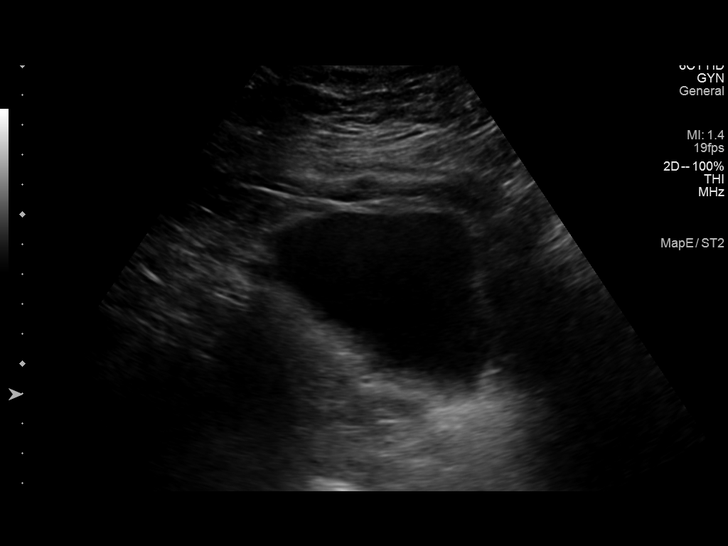
[im 14/18]
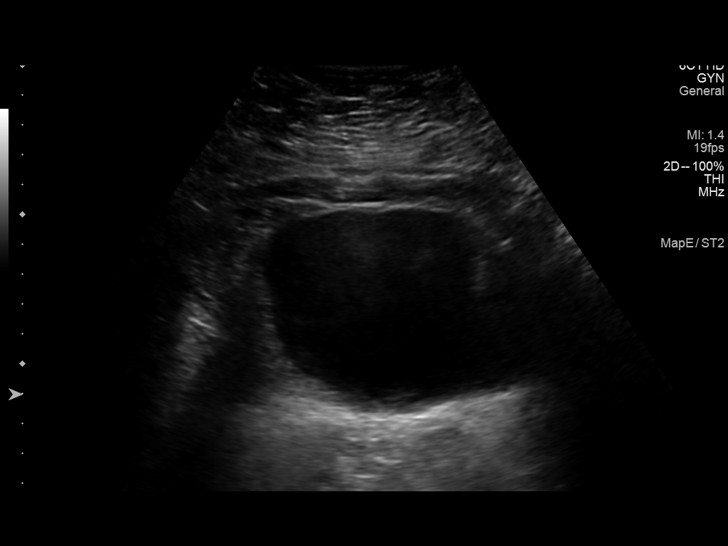
[im 15/18]
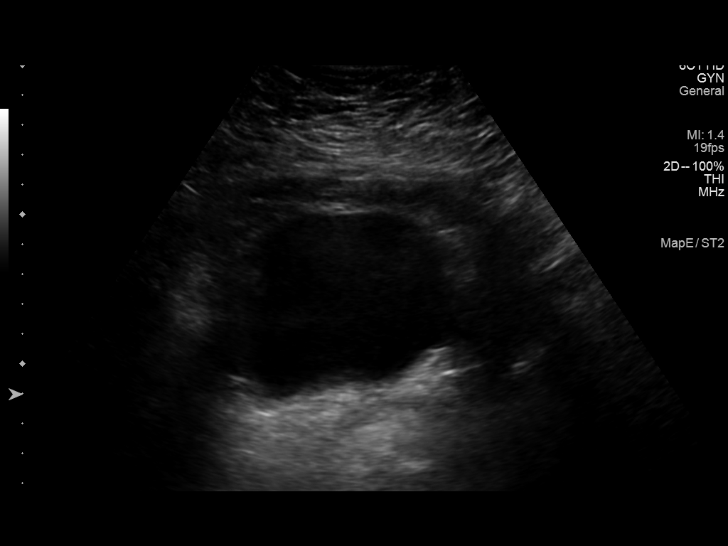
[im 17/18]
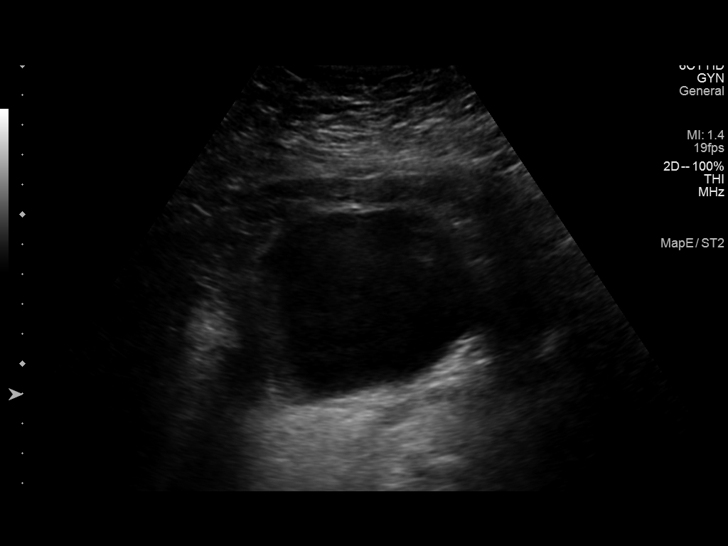
[im 18/18]
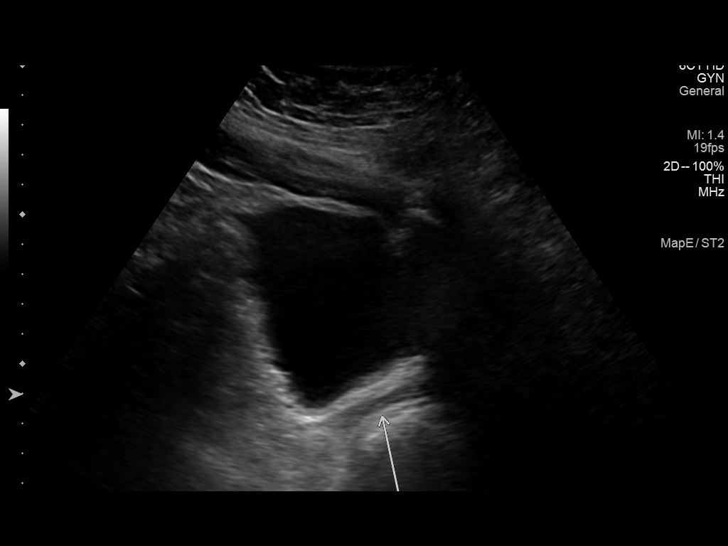

[14 of 18 positions shown; findings below may reference images not displayed]

FINDINGS: Urinary bladder distends normally.

No bladder mass or wall thickening identified.

No shadowing calculi.

Inferior margin urinary bladder appears normal without visualized
abnormal descent in the pelvis.
IMPRESSION: Unremarkable bladder ultrasound.

## 2018-03-25 ENCOUNTER — Telehealth: Payer: Self-pay

## 2018-03-25 NOTE — Telephone Encounter (Signed)
NOTES ON FILE 

## 2018-04-18 ENCOUNTER — Telehealth: Payer: Self-pay

## 2018-04-18 NOTE — Telephone Encounter (Signed)
I called patient to reschedule her 04/30/18 apt with Dr.Koneswaran  I offered her a tele apt, or a webex virtual apt, or to simply reschedule. Patient declined all. Patient stated "I'm ok, go help the sick people"  I inquired if she was still having CP and she said no.I asked her to call us back if she changes her mind.         Cardiac Questionnaire:    Since your last visit or hospitalization:    1. Have you been having new or worsening chest pain? No no   2. Have you been having new or worsening shortness of breath? No  3. Have you been having new or worsening leg swelling, wt gain, or increase in abdominal girth (pants fitting more tightly)? No   4. Have you had any passing out spells? No

## 2018-04-30 ENCOUNTER — Ambulatory Visit: Payer: Medicare HMO | Admitting: Cardiovascular Disease

## 2018-10-14 ENCOUNTER — Encounter: Payer: Self-pay | Admitting: Cardiovascular Disease

## 2018-10-14 ENCOUNTER — Ambulatory Visit (INDEPENDENT_AMBULATORY_CARE_PROVIDER_SITE_OTHER): Payer: Medicare HMO | Admitting: Cardiovascular Disease

## 2018-10-14 ENCOUNTER — Other Ambulatory Visit: Payer: Self-pay

## 2018-10-14 VITALS — BP 114/81 | HR 58 | Temp 97.1°F | Ht 67.0 in | Wt 175.0 lb

## 2018-10-14 DIAGNOSIS — I351 Nonrheumatic aortic (valve) insufficiency: Secondary | ICD-10-CM

## 2018-10-14 DIAGNOSIS — R002 Palpitations: Secondary | ICD-10-CM

## 2018-10-14 DIAGNOSIS — R079 Chest pain, unspecified: Secondary | ICD-10-CM

## 2018-10-14 NOTE — Patient Instructions (Signed)
Medication Instructions: Your physician recommends that you continue on your current medications as directed. Please refer to the Current Medication list given to you today.   Labwork: None today  Procedures/Testing: Your physician has requested that you have an echocardiogram. Echocardiography is a painless test that uses sound waves to create images of your heart. It provides your doctor with information about the size and shape of your heart and how well your heart's chambers and valves are working. This procedure takes approximately one hour. There are no restrictions for this procedure.    Follow-Up: 3 months with Dr.Koneswaran  Any Additional Special Instructions Will Be Listed Below (If Applicable).     If you need a refill on your cardiac medications before your next appointment, please call your pharmacy.        Thank you for choosing [name] !
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
## 2018-10-30 ENCOUNTER — Ambulatory Visit (HOSPITAL_COMMUNITY)
Admission: RE | Admit: 2018-10-30 | Discharge: 2018-10-30 | Disposition: A | Discharge status: Home / Self Care | Payer: Medicare HMO | Source: Ambulatory Visit | Attending: Cardiovascular Disease | Admitting: Cardiovascular Disease

## 2018-10-30 ENCOUNTER — Other Ambulatory Visit: Payer: Self-pay

## 2018-10-30 ENCOUNTER — Telehealth: Payer: Self-pay

## 2018-10-30 DIAGNOSIS — I351 Nonrheumatic aortic (valve) insufficiency: Secondary | ICD-10-CM | POA: Insufficient documentation

## 2018-10-30 NOTE — Progress Notes (Signed)
*  PRELIMINARY RESULTS* Echocardiogram 2D Echocardiogram has been performed.  Kaylee [name] 10/30/2018, 1:49 PM

## 2018-10-30 NOTE — Telephone Encounter (Signed)
-----   Message from Herminio Commons, MD sent at 10/30/2018  2:15 PM EDT ----- Normal pumping function. Some stiffness with heart relaxation. Mild aortic valve leakage, stable since prior study.

## 2018-10-30 NOTE — Telephone Encounter (Signed)
Pt made aware. Voiced understanding. Copy to pcp.  

## 2019-06-12 ENCOUNTER — Ambulatory Visit: Payer: Medicare HMO | Admitting: Gastroenterology

## 2021-03-29 ENCOUNTER — Ambulatory Visit: Payer: Medicare HMO | Admitting: Internal Medicine

## 2021-08-12 NOTE — Progress Notes (Signed)
CARDIOLOGY CONSULT NOTE       Patient ID: Kaylee [name] MRN: [id number] DOB/AGE: 1961/01/22 61 y.o.  Admit date: (Not on file) Referring Physician: Bartolo Darter  Primary Physician: [facility] Primary Cardiologist: New Previous patient Dr Jacinta Shoe 2020 Reason for Consultation: Palpitations/chest pain  Active Problems:   * No active hospital problems. *   HPI:  61 y.o. seen at request of [facility] for chest pain and palpitations Previously seen by Dr Raliegh Ip for same She has had atypical chest pain and benign palpitations TTE 10/30/18 EF 60-65% mild AR and trace MR  She is on no cardiac meds  She has not had any stress testing or monitor   ***  ROS All other systems reviewed and negative except as noted above  Past Medical History:  Diagnosis Date  . Rheumatic fever     Family History  Problem Relation Age of Onset  . Diabetes Mother   . Stroke Mother   . Stroke Father   . Cancer Brother   . Colon cancer Neg Hx     Social History   Socioeconomic History  . Marital status: Married    Spouse name: Not on file  . Number of children: Not on file  . Years of education: Not on file  . Highest education level: Not on file  Occupational History  . Not on file  Tobacco Use  . Smoking status: Never  . Smokeless tobacco: Never  Substance and Sexual Activity  . Alcohol use: No    Alcohol/week: 0.0 standard drinks of alcohol    Comment: rare  . Drug use: No  . Sexual activity: Not on file  Other Topics Concern  . Not on file  Social History Narrative  . Not on file   Social Determinants of Health   Financial Resource Strain: Not on file  Food Insecurity: Not on file  Transportation Needs: Not on file  Physical Activity: Not on file  Stress: Not on file  Social Connections: Not on file  Intimate Partner Violence: Not on file    Past Surgical History:  Procedure Laterality Date  . ABDOMINAL HYSTERECTOMY    . APPENDECTOMY    .  BACK SURGERY    . COLONOSCOPY  2015   Dr. West Carbo: normal  . HAND SURGERY    . TONSILLECTOMY    . TUBAL LIGATION        Current Outpatient Medications:  .  cetirizine (ZYRTEC) 5 MG tablet, Take by mouth., Disp: , Rfl:  .  HYDROcodone-acetaminophen (NORCO) 10-325 MG tablet, Take 1 tablet by mouth., Disp: , Rfl:  .  Melatonin (MELATONIN MAXIMUM STRENGTH) 5 MG TABS, Take by mouth., Disp: , Rfl:  .  Multiple Vitamins-Minerals (PRESERVISION AREDS) CAPS, Take 1 capsule by mouth 2 (two) times daily., Disp: , Rfl:  .  Omega-3 Fatty Acids (FISH OIL) 1000 MG CAPS, Take 1,000 mg by mouth 3 (three) times daily., Disp: , Rfl:  .  psyllium (REGULOID) 0.52 g capsule, Take 0.52 g by mouth daily., Disp: , Rfl:  .  VITAMIN D, ERGOCALCIFEROL, PO, Take by mouth., Disp: , Rfl:     Physical Exam: There were no vitals taken for this visit.    Affect appropriate Healthy:  appears stated age 23: normal Neck supple with no adenopathy JVP normal no bruits no thyromegaly Lungs clear with no wheezing and good diaphragmatic motion Heart:  S1/S2 no murmur, no rub, gallop or click PMI normal Abdomen: benighn,  BS positve, no tenderness, no AAA no bruit.  No HSM or HJR Distal pulses intact with no bruits No edema Neuro non-focal Skin warm and dry No muscular weakness   Labs:   Lab Results  Component Value Date   WBC 7.7 02/26/2008   HGB 11.1 (L) 02/26/2008   HCT 33.5 (L) 02/26/2008   MCV 90.4 02/26/2008   PLT 169 02/26/2008   No results for input(s): "NA", "K", "CL", "CO2", "BUN", "CREATININE", "CALCIUM", "PROT", "BILITOT", "ALKPHOS", "ALT", "AST", "GLUCOSE" in the last 168 hours.  Invalid input(s): "LABALBU" No results found for: "CKTOTAL", "CKMB", "CKMBINDEX", "TROPONINI" No results found for: "CHOL" No results found for: "HDL" No results found for: "LDLCALC" No results found for: "TRIG" No results found for: "CHOLHDL" No results found for: "LDLDIRECT"    Radiology: No results  found.  EKG: SR rate 50 normal 10/14/18 ***   ASSESSMENT AND PLAN:   Chest Pain: previous with stress of parents ill health.death Normal ECG discussed utility of calcium score *** Palpitations: benign see above observe AR:  mild on echo 2020 ***   Calcium score ***  F/U PRN   Signed: Jenkins Rouge 08/12/2021, 5:30 PM

## 2021-08-17 ENCOUNTER — Ambulatory Visit: Payer: Medicare HMO | Admitting: Cardiovascular Disease

## 2021-08-17 ENCOUNTER — Encounter: Payer: Self-pay | Admitting: Cardiovascular Disease

## 2021-08-17 VITALS — BP 110/76 | HR 52 | Ht 67.0 in | Wt 175.6 lb

## 2021-08-17 DIAGNOSIS — R079 Chest pain, unspecified: Secondary | ICD-10-CM

## 2021-08-17 DIAGNOSIS — R011 Cardiac murmur, unspecified: Secondary | ICD-10-CM

## 2021-08-17 DIAGNOSIS — R002 Palpitations: Secondary | ICD-10-CM

## 2021-08-17 NOTE — Patient Instructions (Signed)
Medication Instructions:  Your physician recommends that you continue on your current medications as directed. Please refer to the Current Medication list given to you today.  *If you need a refill on your cardiac medications before your next appointment, please call your pharmacy*   Lab Work: NONE   If you have labs (blood work) drawn today and your tests are completely normal, you will receive your results only by: [place] (if you have MyChart) OR A paper copy in the mail If you have any lab test that is abnormal or we need to change your treatment, we will call you to review the results.   Testing/Procedures: Your physician has requested that you have an echocardiogram. Echocardiography is a painless test that uses sound waves to create images of your heart. It provides your doctor with information about the size and shape of your heart and how well your heart's chambers and valves are working. This procedure takes approximately one hour. There are no restrictions for this procedure.  Calcium Score CT    Follow-Up: At [facility], you and your health needs are our priority.  As part of our continuing mission to provide you with exceptional heart care, we have created designated Provider Care Teams.  These Care Teams include your primary Cardiologist (physician) and Advanced Practice Providers (APPs -  Physician Assistants and Nurse Practitioners) who all work together to provide you with the care you need, when you need it.  We recommend signing up for the patient portal called "MyChart".  Sign up information is provided on this After Visit Summary.  MyChart is used to connect with patients for Virtual Visits (Telemedicine).  Patients are able to view lab/test results, encounter notes, upcoming appointments, etc.  Non-urgent messages can be sent to your provider as well.   To learn more about what you can do with MyChart, go to [email or web address].    Your next  appointment:    As Needed   The format for your next appointment:   In Person  Provider:   Jenkins Rouge, MD    Other Instructions Thank you for choosing [facility]!    Important Information About Sugar

## 2021-08-29 ENCOUNTER — Other Ambulatory Visit (HOSPITAL_COMMUNITY): Payer: Medicare HMO

## 2021-08-29 ENCOUNTER — Encounter (HOSPITAL_COMMUNITY): Payer: Medicare HMO

## 2021-09-27 ENCOUNTER — Ambulatory Visit (HOSPITAL_COMMUNITY)
Admission: RE | Admit: 2021-09-27 | Discharge: 2021-09-27 | Disposition: A | Discharge status: Home / Self Care | Payer: Medicare HMO | Source: Ambulatory Visit | Attending: Cardiovascular Disease | Admitting: Cardiovascular Disease

## 2021-09-27 DIAGNOSIS — R011 Cardiac murmur, unspecified: Secondary | ICD-10-CM | POA: Insufficient documentation

## 2021-09-27 DIAGNOSIS — R079 Chest pain, unspecified: Secondary | ICD-10-CM | POA: Insufficient documentation

## 2021-09-27 DIAGNOSIS — R002 Palpitations: Secondary | ICD-10-CM | POA: Insufficient documentation

## 2021-11-30 ENCOUNTER — Encounter: Payer: Self-pay | Admitting: *Deleted

## 2022-01-02 ENCOUNTER — Encounter: Payer: Self-pay | Admitting: *Deleted

## 2022-01-02 ENCOUNTER — Ambulatory Visit: Payer: Medicare HMO | Admitting: Gastroenterology

## 2022-01-02 ENCOUNTER — Telehealth: Payer: Self-pay | Admitting: *Deleted

## 2022-01-02 ENCOUNTER — Encounter: Payer: Self-pay | Admitting: Gastroenterology

## 2022-01-02 VITALS — BP 114/72 | HR 51 | Temp 97.9°F | Ht 67.0 in | Wt 175.8 lb

## 2022-01-02 DIAGNOSIS — R1319 Other dysphagia: Secondary | ICD-10-CM | POA: Diagnosis not present

## 2022-01-02 DIAGNOSIS — K5900 Constipation, unspecified: Secondary | ICD-10-CM

## 2022-01-02 DIAGNOSIS — R1310 Dysphagia, unspecified: Secondary | ICD-10-CM | POA: Insufficient documentation

## 2022-01-02 MED ORDER — PEG 3350-KCL-NA BICARB-NACL 420 G PO SOLR: 4000.0000 mL | Freq: Once | ORAL | 0 refills | Status: AC

## 2022-01-02 MED ORDER — LINACLOTIDE 145 MCG PO CAPS: 145.0000 ug | ORAL_CAPSULE | Freq: Every day | ORAL | 5 refills | Status: DC

## 2022-01-02 NOTE — Telephone Encounter (Signed)
[employer] PA:  Approved Authorization #[id number]  Tracking #[id number]  DOS: 02/16/22-04/17/22

## 2022-01-02 NOTE — Patient Instructions (Signed)
Start Linzess 166mg daily for constipation.  Plan for upper endoscopy and colonoscopy with Dr.Rourk.

## 2022-01-02 NOTE — Progress Notes (Signed)
GI Office Note    Referring Provider: Tilda Burrow, NP Primary Care Physician:  Tilda Burrow, NP  Primary Gastroenterologist: Garfield Cornea, MD   Chief Complaint   Chief Complaint  Patient presents with   New Patient (Initial Visit)    Referred for dysphagia that comes and goes for about 2 years     History of Present Illness   Kaylee [name] is a 61 y.o. female presenting today at the request of Mallie Snooks, NP for further evaluation of dysphagia.   At first dysphagia used to be to pills only. Now having issues with food and liquids. No heartburn.  No abdominal pain. No vomiting. Nausea in the mornings. No weight loss, able to eat in the afternoons. BM every 2-3 days. Worse over the past few months. Stools are flat. Takes two fiber pills every night chronically No melena. She has had some recent brbpr. Last colonoscopy in 2015 by Dr. West Carbo and was normal.    Medications   Current Outpatient Medications  Medication Sig Dispense Refill   cetirizine (ZYRTEC) 5 MG tablet Take 5 mg by mouth daily.     co-enzyme Q-10 30 MG capsule Take 100 mg by mouth once.     Multiple Vitamins-Minerals (PRESERVISION AREDS) CAPS Take 1 capsule by mouth 2 (two) times daily.     Omega-3 Fatty Acids (FISH OIL) 1000 MG CAPS Take 1,000 mg by mouth 3 (three) times daily.     Turmeric (QC TUMERIC COMPLEX) 500 MG CAPS Take by mouth.     VITAMIN D, ERGOCALCIFEROL, PO Take by mouth.     No current facility-administered medications for this visit.    Allergies   Allergies as of 01/02/2022 - Review Complete 01/02/2022  Allergen Reaction Noted   Penicillins Other (See Comments) 12/11/2014   Sulfa antibiotics Other (See Comments) 12/11/2014   Iodine Other (See Comments) 12/11/2014    Past Medical History   Past Medical History:  Diagnosis Date   Rheumatic fever     Past Surgical History   Past Surgical History:  Procedure Laterality Date   ABDOMINAL HYSTERECTOMY      APPENDECTOMY     BACK SURGERY     COLONOSCOPY  01/23/2013   Dr. West Carbo: normal   HAND SURGERY     arm/hand surgery   TONSILLECTOMY     TUBAL LIGATION      Past Family History   Family History  Problem Relation Age of Onset   Diabetes Mother    Stroke Mother    Stroke Father    Cancer Brother    Colon cancer Neg Hx     Past Social History   Social History   Socioeconomic History   Marital status: Married    Spouse name: Not on file   Number of children: Not on file   Years of education: Not on file   Highest education level: Not on file  Occupational History   Not on file  Tobacco Use   Smoking status: Never   Smokeless tobacco: Never  Substance and Sexual Activity   Alcohol use: No    Alcohol/week: 0.0 standard drinks of alcohol    Comment: rare   Drug use: No   Sexual activity: Not on file  Other Topics Concern   Not on file  Social History Narrative   Not on file   Social Determinants of Health   Financial Resource Strain: Not on file  Food Insecurity: Not on file  Transportation Needs: Not on file  Physical Activity: Not on file  Stress: Not on file  Social Connections: Not on file  Intimate Partner Violence: Not on file    Review of Systems   General: Negative for anorexia, weight loss, fever, chills, fatigue, weakness. Eyes: Negative for vision changes.  ENT: Negative for hoarseness, nasal congestion. +dysphagia CV: Negative for chest pain, angina, palpitations, dyspnea on exertion, peripheral edema.  Respiratory: Negative for dyspnea at rest, dyspnea on exertion, cough, sputum, wheezing.  GI: See history of present illness. GU:  Negative for dysuria, hematuria, urinary incontinence, urinary frequency, nocturnal urination.  MS: Negative for joint pain, low back pain.  Derm: Negative for rash or itching.  Neuro: Negative for weakness, abnormal sensation, seizure, frequent headaches, memory loss,  confusion.  Psych: Negative for anxiety,  depression, suicidal ideation, hallucinations.  Endo: Negative for unusual weight change.  Heme: Negative for bruising or bleeding. Allergy: Negative for rash or hives.  Physical Exam   BP 114/72   Pulse (!) 51   Temp 97.9 F (36.6 C)   Ht '5\' 7"'$  (1.702 m)   Wt 175 lb 12.8 oz (79.7 kg)   BMI 27.53 kg/m    General: Well-nourished, well-developed in no acute distress.  Head: Normocephalic, atraumatic.   Eyes: Conjunctiva pink, no icterus. Mouth: Oropharyngeal mucosa moist and pink , no lesions erythema or exudate. Neck: Supple without thyromegaly, masses, or lymphadenopathy.  Lungs: Clear to auscultation bilaterally.  Heart: Regular rate and rhythm, no murmurs rubs or gallops.  Abdomen: Bowel sounds are normal, nontender, nondistended, no hepatosplenomegaly or masses,  no abdominal bruits or hernia, no rebound or guarding.   Rectal: not performed Extremities: No lower extremity edema. No clubbing or deformities.  Neuro: Alert and oriented x 4 , grossly normal neurologically.  Skin: Warm and dry, no rash or jaundice.   Psych: Alert and cooperative, normal mood and affect.  Labs   None available  Imaging Studies   No results found.  Assessment   Dysphagia: to solids/liquids/pills. Recommend EGD to evaluate for esophageal stricture with plans for dilation.   Constipation: worsening bowel function, less frequent stools. Stools are flat. Intermittent brbpr, possibly from benign anorectal source but needs updated colonoscopy to rule out polyps, malignancy.    PLAN   Linzess 127mg daily for constipation. EGD/ED/colonoscopy with Dr. RGala Romney ASA 2.  I have discussed the risks, alternatives, benefits with regards to but not limited to the risk of reaction to medication, bleeding, infection, perforation and the patient is agreeable to proceed. Written consent to be obtained.    LLaureen Ochs LBobby Rumpf [facility]

## 2022-01-04 ENCOUNTER — Ambulatory Visit: Payer: Medicare HMO | Admitting: Gastroenterology

## 2022-02-16 ENCOUNTER — Ambulatory Visit (HOSPITAL_COMMUNITY)
Admission: RE | Admit: 2022-02-16 | Discharge: 2022-02-16 | Disposition: A | Discharge status: Home / Self Care | Payer: Medicare HMO | Attending: Internal Medicine | Admitting: Internal Medicine

## 2022-02-16 ENCOUNTER — Telehealth: Payer: Self-pay

## 2022-02-16 ENCOUNTER — Ambulatory Visit (HOSPITAL_BASED_OUTPATIENT_CLINIC_OR_DEPARTMENT_OTHER): Payer: Medicare HMO | Admitting: Anesthesiology

## 2022-02-16 ENCOUNTER — Other Ambulatory Visit: Payer: Self-pay

## 2022-02-16 ENCOUNTER — Ambulatory Visit (HOSPITAL_COMMUNITY): Payer: Medicare HMO | Admitting: Anesthesiology

## 2022-02-16 ENCOUNTER — Encounter (HOSPITAL_COMMUNITY)
Admission: RE | Disposition: A | Discharge status: Home / Self Care | Payer: Self-pay | Source: Home / Self Care | Attending: Internal Medicine

## 2022-02-16 ENCOUNTER — Encounter (HOSPITAL_COMMUNITY): Payer: Self-pay | Admitting: Internal Medicine

## 2022-02-16 DIAGNOSIS — R1310 Dysphagia, unspecified: Secondary | ICD-10-CM

## 2022-02-16 DIAGNOSIS — K5900 Constipation, unspecified: Secondary | ICD-10-CM | POA: Insufficient documentation

## 2022-02-16 DIAGNOSIS — Z1211 Encounter for screening for malignant neoplasm of colon: Secondary | ICD-10-CM

## 2022-02-16 DIAGNOSIS — K2100 Gastro-esophageal reflux disease with esophagitis, without bleeding: Secondary | ICD-10-CM | POA: Diagnosis not present

## 2022-02-16 DIAGNOSIS — K449 Diaphragmatic hernia without obstruction or gangrene: Secondary | ICD-10-CM | POA: Diagnosis not present

## 2022-02-16 DIAGNOSIS — K5730 Diverticulosis of large intestine without perforation or abscess without bleeding: Secondary | ICD-10-CM | POA: Diagnosis not present

## 2022-02-16 DIAGNOSIS — R1314 Dysphagia, pharyngoesophageal phase: Secondary | ICD-10-CM | POA: Diagnosis present

## 2022-02-16 DIAGNOSIS — K635 Polyp of colon: Secondary | ICD-10-CM | POA: Insufficient documentation

## 2022-02-16 DIAGNOSIS — K2210 Ulcer of esophagus without bleeding: Secondary | ICD-10-CM | POA: Insufficient documentation

## 2022-02-16 DIAGNOSIS — R194 Change in bowel habit: Secondary | ICD-10-CM

## 2022-02-16 DIAGNOSIS — D124 Benign neoplasm of descending colon: Secondary | ICD-10-CM | POA: Diagnosis not present

## 2022-02-16 HISTORY — PX: ESOPHAGOGASTRODUODENOSCOPY (EGD) WITH PROPOFOL: SHX5813

## 2022-02-16 HISTORY — PX: POLYPECTOMY: SHX5525

## 2022-02-16 HISTORY — PX: MALONEY DILATION: SHX5535

## 2022-02-16 HISTORY — PX: COLONOSCOPY WITH PROPOFOL: SHX5780

## 2022-02-16 SURGERY — COLONOSCOPY WITH PROPOFOL
Anesthesia: General

## 2022-02-16 MED ORDER — PANTOPRAZOLE SODIUM 40 MG PO TBEC: 40.0000 mg | DELAYED_RELEASE_TABLET | Freq: Every day | ORAL | 11 refills | Status: DC

## 2022-02-16 MED ORDER — LACTATED RINGERS IV SOLN: INTRAVENOUS | Status: DC

## 2022-02-16 MED ORDER — PROPOFOL 10 MG/ML IV BOLUS
INTRAVENOUS | Status: DC | PRN
  Administered 2022-02-16: 80 mg via INTRAVENOUS

## 2022-02-16 MED ORDER — PROPOFOL 500 MG/50ML IV EMUL
INTRAVENOUS | Status: DC | PRN
  Administered 2022-02-16: 200 ug/kg/min via INTRAVENOUS

## 2022-02-16 MED ORDER — LIDOCAINE HCL (CARDIAC) PF 100 MG/5ML IV SOSY
PREFILLED_SYRINGE | INTRAVENOUS | Status: DC | PRN
  Administered 2022-02-16: 50 mg via INTRATRACHEAL

## 2022-02-16 NOTE — Transfer of Care (Signed)
Immediate Anesthesia Transfer of Care Note  Patient: Kaylee [name]  Procedure(s) Performed: COLONOSCOPY WITH PROPOFOL ESOPHAGOGASTRODUODENOSCOPY (EGD) WITH PROPOFOL [place]IL[place] POLYPECTOMY  Patient Location: Endoscopy Unit  Anesthesia Type:General  Level of Consciousness: awake, alert , oriented, and patient cooperative  Airway & Oxygen Therapy: Patient Spontanous Breathing  Post-op Assessment: Report given to RN, Post -op Vital signs reviewed and stable, and Patient moving all extremities  Post vital signs: Reviewed and stable  Last Vitals:  Vitals Value Taken Time  BP 92/55 02/16/22 0952  Temp 36.4 C 02/16/22 0952  Pulse 69 02/16/22 0952  Resp 17 02/16/22 0952  SpO2 99 % 02/16/22 0952    Last Pain:  Vitals:   02/16/22 0952  TempSrc: Axillary  PainSc: 0-No pain      Patients Stated Pain Goal: 7 (53/91/22 5834)  Complications: No notable events documented.

## 2022-02-16 NOTE — Discharge Instructions (Addendum)
EGD Discharge instructions Please read the instructions outlined below and refer to this sheet in the next few weeks. These discharge instructions provide you with general information on caring for yourself after you leave the hospital. Your doctor may also give you specific instructions. While your treatment has been planned according to the most current medical practices available, unavoidable complications occasionally occur. If you have any problems or questions after discharge, please call your doctor. ACTIVITY You may resume your regular activity but move at a slower pace for the next 24 hours.  Take frequent rest periods for the next 24 hours.  Walking will help expel (get rid of) the air and reduce the bloated feeling in your abdomen.  No driving for 24 hours (because of the anesthesia (medicine) used during the test).  You may shower.  Do not sign any important legal documents or operate any machinery for 24 hours (because of the anesthesia used during the test).  NUTRITION Drink plenty of fluids.  You may resume your normal diet.  Begin with a light meal and progress to your normal diet.  Avoid alcoholic beverages for 24 hours or as instructed by your caregiver.  MEDICATIONS You may resume your normal medications unless your caregiver tells you otherwise.  WHAT YOU CAN EXPECT TODAY You may experience abdominal discomfort such as a feeling of fullness or "gas" pains.  FOLLOW-UP Your doctor will discuss the results of your test with you.  SEEK IMMEDIATE MEDICAL ATTENTION IF ANY OF THE FOLLOWING OCCUR: Excessive nausea (feeling sick to your stomach) and/or vomiting.  Severe abdominal pain and distention (swelling).  Trouble swallowing.  Temperature over 101 F (37.8 C).  Rectal bleeding or vomiting of blood.     Colonoscopy Discharge Instructions  Read the instructions outlined below and refer to this sheet in the next few weeks. These discharge instructions provide you with  general information on caring for yourself after you leave the hospital. Your doctor may also give you specific instructions. While your treatment has been planned according to the most current medical practices available, unavoidable complications occasionally occur. If you have any problems or questions after discharge, call Dr. Gala Romney at [phone]. ACTIVITY You may resume your regular activity, but move at a slower pace for the next 24 hours.  Take frequent rest periods for the next 24 hours.  Walking will help get rid of the air and reduce the bloated feeling in your belly (abdomen).  No driving for 24 hours (because of the medicine (anesthesia) used during the test).   Do not sign any important legal documents or operate any machinery for 24 hours (because of the anesthesia used during the test).  NUTRITION Drink plenty of fluids.  You may resume your normal diet as instructed by your doctor.  Begin with a light meal and progress to your normal diet. Heavy or fried foods are harder to digest and may make you feel sick to your stomach (nauseated).  Avoid alcoholic beverages for 24 hours or as instructed.  MEDICATIONS You may resume your normal medications unless your doctor tells you otherwise.  WHAT YOU CAN EXPECT TODAY Some feelings of bloating in the abdomen.  Passage of more gas than usual.  Spotting of blood in your stool or on the toilet paper.  IF YOU HAD POLYPS REMOVED DURING THE COLONOSCOPY: No aspirin products for 7 days or as instructed.  No alcohol for 7 days or as instructed.  Eat a soft diet for the next 24 hours.  FINDING OUT THE RESULTS OF YOUR TEST Not all test results are available during your visit. If your test results are not back during the visit, make an appointment with your caregiver to find out the results. Do not assume everything is normal if you have not heard from your caregiver or the medical facility. It is important for you to follow up on all of your test  results.  SEEK IMMEDIATE MEDICAL ATTENTION IF: You have more than a spotting of blood in your stool.  Your belly is swollen (abdominal distention).  You are nauseated or vomiting.  You have a temperature over 101.  You have abdominal pain or discomfort that is severe or gets worse throughout the day    You have acid reflux esophagitis.  Your esophagus was dilated today  begin Protonix 40 mg once daily 30 minutes before breakfast to control acid reflux.  New prescription provided through our office.  1 polyp removed from your colon today  Diverticulosis and polyp information provided  At patient request, I called Alize Borrayo at [phone] -   No answer   begin taking Linzess 145 daily for constipation as previously prescribed.  It is safe.  Office visit with [place] and 6 weeks-Leslie Lewis. Office will notify you of appointment

## 2022-02-16 NOTE — Telephone Encounter (Signed)
-----  Message from Daneil Dolin, MD sent at 02/16/2022 10:02 AM EST -----     New prescription for Protonix 40 mg tablet dispense 30 with 11 refills take 1 daily 30 minutes before breakfast.

## 2022-02-16 NOTE — Anesthesia Preprocedure Evaluation (Signed)
Anesthesia Evaluation  Patient identified by MRN, date of birth, ID band Patient awake    Reviewed: Allergy & Precautions, H&P , NPO status , Patient's Chart, lab work & pertinent test results  History of Anesthesia Complications Negative for: history of anesthetic complications  Airway Mallampati: II  TM Distance: >3 FB Neck ROM: Full    Dental  (+) Dental Advisory Given, Teeth Intact   Pulmonary neg pulmonary ROS   Pulmonary exam normal breath sounds clear to auscultation       Cardiovascular negative cardio ROS Normal cardiovascular exam Rhythm:Regular Rate:Normal     Neuro/Psych negative neurological ROS  negative psych ROS   GI/Hepatic Neg liver ROS,GERD (dysphagia)  ,,  Endo/Other  negative endocrine ROS    Renal/GU negative Renal ROS  negative genitourinary   Musculoskeletal negative musculoskeletal ROS (+)    Abdominal   Peds negative pediatric ROS (+)  Hematology negative hematology ROS (+)   Anesthesia Other Findings   Reproductive/Obstetrics negative OB ROS                             Anesthesia Physical Anesthesia Plan  ASA: 1  Anesthesia Plan: General   Post-op Pain Management: Minimal or no pain anticipated   Induction:   PONV Risk Score and Plan: 1 and Propofol infusion  Airway Management Planned: Nasal Cannula and Natural Airway  Additional Equipment:   Intra-op Plan:   Post-operative Plan:   Informed Consent: I have reviewed the patients History and Physical, chart, labs and discussed the procedure including the risks, benefits and alternatives for the proposed anesthesia with the patient or authorized representative who has indicated his/her understanding and acceptance.     Dental advisory given  Plan Discussed with: CRNA and Surgeon  Anesthesia Plan Comments:         Anesthesia Quick Evaluation

## 2022-02-16 NOTE — Anesthesia Postprocedure Evaluation (Signed)
Anesthesia Post Note  Patient: Kaylee [name]  Procedure(s) Performed: COLONOSCOPY WITH PROPOFOL ESOPHAGOGASTRODUODENOSCOPY (EGD) WITH PROPOFOL [place] POLYPECTOMY  Patient location during evaluation: Phase II Anesthesia Type: General Level of consciousness: awake and alert and oriented Pain management: pain level controlled Vital Signs Assessment: post-procedure vital signs reviewed and stable Respiratory status: spontaneous breathing, nonlabored ventilation and respiratory function stable Cardiovascular status: blood pressure returned to baseline and stable Postop Assessment: no apparent nausea or vomiting Anesthetic complications: no  No notable events documented.   Last Vitals:  Vitals:   02/16/22 0732 02/16/22 0952  BP: 130/76 (!) 92/55  Pulse: 68 69  Resp: 11 17  Temp: 36.7 C (!) 36.4 C  SpO2: 100% 99%    Last Pain:  Vitals:   02/16/22 0952  TempSrc: Axillary  PainSc: 0-No pain                 Azaan Leask C Luceal Hollibaugh

## 2022-02-16 NOTE — H&P (Signed)
$'@LOGO'h$ @   Primary Care Physician:  Tilda Burrow, NP Primary Gastroenterologist:  Dr. Gala Romney  Pre-Procedure History & Physical: HPI:  Kaylee [name] is a 62 y.o. female here for  further evaluation of esophageal dysphagia via EGD.  Also, rare intermittent rectal bleeding.  Diagnostic colonoscopy planned today as well [place]  Past Medical History:  Diagnosis Date   Rheumatic fever     Past Surgical History:  Procedure Laterality Date   ABDOMINAL HYSTERECTOMY     APPENDECTOMY     COLONOSCOPY  01/23/2013   Dr. West Carbo: normal   HAND SURGERY     arm/hand surgery   TONSILLECTOMY     TUBAL LIGATION      Prior to Admission medications   Medication Sig Start Date End Date Taking? Authorizing Provider  Camphor-Menthol-Methyl Sal (TIGER BALM MUSCLE RUB EX) Apply 1 Application topically daily as needed (pain).   Yes [provider]  cetirizine (ZYRTEC) 10 MG tablet Take 5 mg by mouth at bedtime.   Yes [provider]  Cholecalciferol (VITAMIN D) 50 MCG (2000 UT) tablet Take 2,000 Units by mouth at bedtime.   Yes [provider]  cholecalciferol (VITAMIN D3) 25 MCG (1000 UNIT) tablet Take 1,000 Units by mouth at bedtime.   Yes [provider]  Coenzyme Q10 (COQ-10 PO) Take 1 tablet by mouth at bedtime.   Yes [provider]  doxylamine, Sleep, (UNISOM) 25 MG tablet Take 12.5 mg by mouth at bedtime as needed for sleep.   Yes [provider]  FIBER PO Take 2 capsules by mouth at bedtime.   Yes [provider]  MAGNESIUM CITRATE PO Take 1 tablet by mouth at bedtime.   Yes [provider]  melatonin 5 MG TABS Take 5 mg by mouth at bedtime.   Yes [provider]  Multiple Vitamins-Minerals (PRESERVISION AREDS) CAPS Take 1 capsule by mouth at bedtime.   Yes [provider]  Omega-3 Fatty Acids (FISH OIL) 1200 MG CAPS Take 1,200 mg by mouth at bedtime.   Yes  [provider]  Turmeric (QC TUMERIC COMPLEX) 500 MG CAPS Take 500 mg by mouth at bedtime.   Yes [provider]  linaclotide Rolan Lipa) 145 MCG CAPS capsule Take 1 capsule (145 mcg total) by mouth daily before breakfast. Patient not taking: Reported on 02/14/2022 01/02/22   [name], PA-C    Allergies as of 01/26/2022 - Review Complete 01/02/2022  Allergen Reaction Noted   Penicillins Other (See Comments) 12/11/2014   Sulfa antibiotics Other (See Comments) 12/11/2014   Iodine Other (See Comments) 12/11/2014    Family History  Problem Relation Age of Onset   Diabetes Mother    Stroke Mother    Stroke Father    Cancer Brother    Colon cancer Neg Hx     Social History   Socioeconomic History   Marital status: Married    Spouse name: Not on file   Number of children: Not on file   Years of education: Not on file   Highest education level: Not on file  Occupational History   Not on file  Tobacco Use   Smoking status: Never   Smokeless tobacco: Never  Vaping Use   Vaping Use: Never used  Substance and Sexual Activity   Alcohol use: No    Alcohol/week: 0.0 standard drinks of alcohol    Comment: rare   Drug use: No   Sexual activity: Not  on file  Other Topics Concern   Not on file  Social History Narrative   Not on file   Social Determinants of Health   Financial Resource Strain: Not on file  Food Insecurity: Not on file  Transportation Needs: Not on file  Physical Activity: Not on file  Stress: Not on file  Social Connections: Not on file  Intimate Partner Violence: Not on file    Review of Systems: See HPI, otherwise negative ROS  Physical Exam: BP 130/76   Pulse 68   Temp 98 F (36.7 C) (Oral)   Resp 11   Ht '5\' 7"'$  (1.702 m)   Wt 77.6 kg   SpO2 100%   BMI 26.78 kg/m  General:   Alert,  Well-developed, well-nourished, pleasant and cooperative in NAD Neck:  Supple; no masses or thyromegaly. No significant cervical  adenopathy. Lungs:  Clear throughout to auscultation.   No wheezes, crackles, or rhonchi. No acute distress. Heart:  Regular rate and rhythm; no murmurs, clicks, rubs,  or gallops. Abdomen: Non-distended, normal bowel sounds.  Soft and nontender without appreciable mass or hepatosplenomegaly.  Pulses:  Normal pulses noted. Extremities:  Without clubbing or edema.  Impression/Plan:    62 year old lady with esophageal dysphagia here for further evaluation and treatment via EGD/EGD as appropriate also rare intermittent rectal bleeding  Diagnostic colonoscopy today as well.  The risks, benefits, limitations, imponderables and alternatives regarding both EGD and colonoscopy have been reviewed with the patient. Questions have been answered. All parties agreeable.       Notice: This dictation was prepared with Dragon dictation along with smaller phrase technology. Any transcriptional errors that result from this process are unintentional and may not be corrected upon review.

## 2022-02-16 NOTE — Op Note (Signed)
[facility] Patient Name: Kaylee [name] Procedure Date: 02/16/2022 9:28 AM MRN: [id number] Date of Birth: 03/06/1960 Attending MD: Norvel Richards , MD, [id number] CSN: [id number] Age: 62 Admit Type: Outpatient Procedure:                Colonoscopy Indications:              Screening for colorectal malignant neoplasm Providers:                Norvel Richards, MD, Janeece Riggers, RN, Vista Mink, Technician Referring MD:              Medicines:                Propofol per Anesthesia Complications:            No immediate complications. Estimated Blood Loss:     Estimated blood loss was minimal. Procedure:                Pre-Anesthesia Assessment:                           - Prior to the procedure, a History and Physical                            was performed, and patient medications and                            allergies were reviewed. The patient's tolerance of                            previous anesthesia was also reviewed. The risks                            and benefits of the procedure and the sedation                            options and risks were discussed with the patient.                            All questions were answered, and informed consent                            was obtained. Prior Anticoagulants: The patient has                            taken no anticoagulant or antiplatelet agents. ASA                            Grade Assessment: II - A patient with mild systemic                            disease. After reviewing the risks and benefits,  the patient was deemed in satisfactory condition to                            undergo the procedure.                           After obtaining informed consent, the colonoscope                            was passed under direct vision. Throughout the                            procedure, the patient's blood pressure, pulse, and                             oxygen saturations were monitored continuously. The                            [id number] scope was introduced through the                            anus and advanced to the the cecum, identified by                            appendiceal orifice and ileocecal valve. The                            colonoscopy was performed without difficulty. The                            patient tolerated the procedure well. The quality                            of the bowel preparation was adequate. The                            ileocecal valve, appendiceal orifice, and rectum                            were photographed. The entire colon was well                            visualized. Scope In: 9:32:10 AM Scope Out: 9:48:07 AM Scope Withdrawal Time: 0 hours 12 minutes 42 seconds  Total Procedure Duration: 0 hours 15 minutes 57 seconds  Findings:      The perianal and digital rectal examinations were normal.      A few small-mouthed diverticula were found in the sigmoid colon.      A 4 mm polyp was found in the descending colon. The polyp was sessile.       The polyp was removed with a cold snare. Resection and retrieval were       complete. Estimated blood loss was minimal.      The exam was otherwise without abnormality on direct and retroflexion       views. Impression:               -  Diverticulosis in the sigmoid colon.                           - One 4 mm polyp in the descending colon, removed                            with a cold snare. Resected and retrieved.                           - The examination was otherwise normal on direct                            and retroflexion views. Moderate Sedation:      Moderate (conscious) sedation was personally administered by an       anesthesia professional. The following parameters were monitored: oxygen       saturation, heart rate, blood pressure, respiratory rate, EKG, adequacy       of pulmonary ventilation, and response to  care. Recommendation:           - Patient has a contact number available for                            emergencies. The signs and symptoms of potential                            delayed complications were discussed with the                            patient. Return to normal activities tomorrow.                            Written discharge instructions were provided to the                            patient.                           - Advance diet as tolerated.                           - Continue present medications.                           - Repeat colonoscopy date to be determined after                            pending pathology results are reviewed for                            surveillance.                           - Return to GI office in 6 weeks. See EGD report.                            Patient has  Linzess 145 at home. New prescription                            provided by our office. Read about side effects                            prior to take it. Recommended she begin Linzess 145                            daily for constipation. Procedure Code(s):        --- Professional ---                           [phone], Colonoscopy, flexible; with removal of                            tumor(s), polyp(s), or other lesion(s) by snare                            technique Diagnosis Code(s):        --- Professional ---                           Z12.11, Encounter for screening for malignant                            neoplasm of colon                           D12.4, Benign neoplasm of descending colon                           K57.30, Diverticulosis of large intestine without                            perforation or abscess without bleeding CPT copyright 2022 American [employer]. All rights reserved. The codes documented in this report are preliminary and upon coder review may  be revised to meet current compliance requirements. Cristopher Estimable. Bonnell Placzek, MD Norvel Richards, MD 02/16/2022 9:56:49 AM This report has been signed electronically. Number of Addenda: 0

## 2022-02-17 ENCOUNTER — Encounter: Payer: Self-pay | Admitting: Internal Medicine

## 2022-02-17 LAB — SURGICAL PATHOLOGY

## 2022-02-27 NOTE — Op Note (Signed)
[facility] Patient Name: Kaylee [name] Procedure Date: 02/16/2022 9:02 AM MRN: [id number] Date of Birth: 1960/11/19 Attending MD: Norvel Richards , MD, [id number] CSN: [id number] Age: 62 Admit Type: Outpatient Procedure:                Upper GI endoscopy Indications:              Dysphagia Providers:                Norvel Richards, MD, Janeece Riggers, RN, Dereck Leep, Technician Referring MD:              Medicines:                Propofol per Anesthesia Complications:            No immediate complications. Estimated Blood Loss:     Estimated blood loss: none. Procedure:                Pre-Anesthesia Assessment:                           - Prior to the procedure, a History and Physical                            was performed, and patient medications and                            allergies were reviewed. The patient's tolerance of                            previous anesthesia was also reviewed. The risks                            and benefits of the procedure and the sedation                            options and risks were discussed with the patient.                            All questions were answered, and informed consent                            was obtained. Prior Anticoagulants: The patient has                            taken no anticoagulant or antiplatelet agents. ASA                            Grade Assessment: II - A patient with mild systemic                            disease. After reviewing the risks and benefits,  the patient was deemed in satisfactory condition to                            undergo the procedure.                           After obtaining informed consent, the endoscope was                            passed under direct vision. Throughout the                            procedure, the patient's blood pressure, pulse, and                            oxygen saturations were  monitored continuously. The                            [id number]-H190 ([id number]) scope was introduced through the                            mouth, and advanced to the second part of duodenum.                            The upper GI endoscopy was accomplished without                            difficulty. The patient tolerated the procedure                            well. Scope In: 9:21:14 AM Scope Out: 9:26:22 AM Total Procedure Duration: 0 hours 5 minutes 8 seconds  Findings:      Single 2 cm linear esophageal erosion coming up from the GE junction. No       stricture or ring Barrett's epithelium or nodularity. Tubular esophagus       appeared patent throughout its course.      Gastric cavity empty. Normal-appearing gastric mucosa.      A medium-sized hiatal hernia was present. Patent pylorus.      The duodenal bulb and second portion of the duodenum were normal. Scope       was withdrawn and a 54 French Maloney dilator was passed to full       insertion with no resistance. A look back revealed no apparent       complication related to this maneuver. Impression:               - Erosive reflux esophagitis. Status post [facility]                            dilation. Medium-sized hiatal hernia.                           - Normal duodenal bulb and second portion of the  duodenum.                           - No specimens collected. Moderate Sedation:      Moderate (conscious) sedation was personally administered by an       anesthesia professional. The following parameters were monitored: oxygen       saturation, heart rate, blood pressure, respiratory rate, EKG, adequacy       of pulmonary ventilation, and response to care. Recommendation:           - Patient has a contact number available for                            emergencies. The signs and symptoms of potential                            delayed complications were discussed with the                             patient. Return to normal activities tomorrow.                            Written discharge instructions were provided to the                            patient.                           - Advance diet as tolerated. Begin Protonix 40 mg 1                            tablet daily 30 minutes before breakfast. New                            prescription provided through our office.                           -See colonoscopy report. Procedure Code(s):        --- Professional ---                           [phone], Esophagogastroduodenoscopy, flexible,                            transoral; diagnostic, including collection of                            specimen(s) by brushing or washing, when performed                            (separate procedure) Diagnosis Code(s):        --- Professional ---                           K44.9, Diaphragmatic hernia without obstruction or  gangrene                           R13.10, Dysphagia, unspecified CPT copyright 2022 American Medical Association. All rights reserved. The codes documented in this report are preliminary and upon coder review may  be revised to meet current compliance requirements. Cristopher Estimable. Dandre Sisler, MD Norvel Richards, MD 02/27/2022 11:49:41 AM This report has been signed electronically. Number of Addenda: 0

## 2022-02-28 ENCOUNTER — Encounter (HOSPITAL_COMMUNITY): Payer: Self-pay | Admitting: Internal Medicine

## 2022-03-27 NOTE — Progress Notes (Unsigned)
GI Office Note    Referring Provider: Tilda Burrow, NP Primary Care Physician:  Tilda Burrow, NP  Primary Gastroenterologist:  Chief Complaint   No chief complaint on file.   History of Present Illness   Kaylee [name] is a 62 y.o. female presenting today for follow up. Last seen 12/2021. History of dysphagia, constipation.     EGD 02/16/2022: -Erosive reflux esophagitis s/p dilaiton -medium sized hh  Colonoscopy 02/16/2022: -diverticulosis -one 37m polyp descending colon, hyperplastic -next colonoscopy in 10 years  Medications   Current Outpatient Medications  Medication Sig Dispense Refill   Camphor-Menthol-Methyl Sal (TIGER BALM MUSCLE RUB EX) Apply 1 Application topically daily as needed (pain).     cetirizine (ZYRTEC) 10 MG tablet Take 5 mg by mouth at bedtime.     Cholecalciferol (VITAMIN D) 50 MCG (2000 UT) tablet Take 2,000 Units by mouth at bedtime.     cholecalciferol (VITAMIN D3) 25 MCG (1000 UNIT) tablet Take 1,000 Units by mouth at bedtime.     Coenzyme Q10 (COQ-10 PO) Take 1 tablet by mouth at bedtime.     doxylamine, Sleep, (UNISOM) 25 MG tablet Take 12.5 mg by mouth at bedtime as needed for sleep.     FIBER PO Take 2 capsules by mouth at bedtime.     linaclotide (LINZESS) 145 MCG CAPS capsule Take 1 capsule (145 mcg total) by mouth daily before breakfast. (Patient not taking: Reported on 02/14/2022) 30 capsule 5   MAGNESIUM CITRATE PO Take 1 tablet by mouth at bedtime.     melatonin 5 MG TABS Take 5 mg by mouth at bedtime.     Multiple Vitamins-Minerals (PRESERVISION AREDS) CAPS Take 1 capsule by mouth at bedtime.     Omega-3 Fatty Acids (FISH OIL) 1200 MG CAPS Take 1,200 mg by mouth at bedtime.     pantoprazole (PROTONIX) 40 MG tablet Take 1 tablet (40 mg total) by mouth daily. 30 tablet 11   Turmeric (QC TUMERIC COMPLEX) 500 MG CAPS Take 500 mg by mouth at bedtime.     No current facility-administered medications for this visit.    Allergies    Allergies as of 03/28/2022 - Review Complete 02/16/2022  Allergen Reaction Noted   Penicillins Shortness Of Breath 12/11/2014   Iodinated contrast media Other (See Comments) 01/22/2012   Sulfa antibiotics Other (See Comments) 12/11/2014   Iodine Other (See Comments) 12/11/2014     Past Medical History   Past Medical History:  Diagnosis Date   Rheumatic fever     Past Surgical History   Past Surgical History:  Procedure Laterality Date   ABDOMINAL HYSTERECTOMY     APPENDECTOMY     COLONOSCOPY  01/23/2013   Dr. SWest Carbo normal   COLONOSCOPY WITH PROPOFOL N/A 02/16/2022   Procedure: COLONOSCOPY WITH PROPOFOL;  Surgeon: RDaneil Dolin MD;  Location: [facility];  Service: Endoscopy;  Laterality: N/A;  9:00 am   ESOPHAGOGASTRODUODENOSCOPY (EGD) WITH PROPOFOL N/A 02/16/2022   Procedure: ESOPHAGOGASTRODUODENOSCOPY (EGD) WITH PROPOFOL;  Surgeon: RDaneil Dolin MD;  Location: [facility];  Service: Endoscopy;  Laterality: N/A;   HAND SURGERY     arm/hand surgery   MALONEY DILATION N/A 02/16/2022   Procedure: MALONEY DILATION;  Surgeon: RDaneil Dolin MD;  Location: [facility];  Service: Endoscopy;  Laterality: N/A;   POLYPECTOMY  02/16/2022   Procedure: POLYPECTOMY;  Surgeon: RDaneil Dolin MD;  Location: [facility];  Service: Endoscopy;;   TONSILLECTOMY  TUBAL LIGATION      Past Family History   Family History  Problem Relation Age of Onset   Diabetes Mother    Stroke Mother    Stroke Father    Cancer Brother    Colon cancer Neg Hx     Past Social History   Social History   Socioeconomic History   Marital status: Married    Spouse name: Not on file   Number of children: Not on file   Years of education: Not on file   Highest education level: Not on file  Occupational History   Not on file  Tobacco Use   Smoking status: Never   Smokeless tobacco: Never  Vaping Use   Vaping Use: Never used  Substance and Sexual Activity   Alcohol use: No     Alcohol/week: 0.0 standard drinks of alcohol    Comment: rare   Drug use: No   Sexual activity: Not on file  Other Topics Concern   Not on file  Social History Narrative   Not on file   Social Determinants of Health   Financial Resource Strain: Not on file  Food Insecurity: Not on file  Transportation Needs: Not on file  Physical Activity: Not on file  Stress: Not on file  Social Connections: Not on file  Intimate Partner Violence: Not on file    Review of Systems   General: Negative for anorexia, weight loss, fever, chills, fatigue, weakness. ENT: Negative for hoarseness, difficulty swallowing , nasal congestion. CV: Negative for chest pain, angina, palpitations, dyspnea on exertion, peripheral edema.  Respiratory: Negative for dyspnea at rest, dyspnea on exertion, cough, sputum, wheezing.  GI: See history of present illness. GU:  Negative for dysuria, hematuria, urinary incontinence, urinary frequency, nocturnal urination.  Endo: Negative for unusual weight change.     Physical Exam   There were no vitals taken for this visit.   General: Well-nourished, well-developed in no acute distress.  Eyes: No icterus. Mouth: Oropharyngeal mucosa moist and pink , no lesions erythema or exudate. Lungs: Clear to auscultation bilaterally.  Heart: Regular rate and rhythm, no murmurs rubs or gallops.  Abdomen: Bowel sounds are normal, nontender, nondistended, no hepatosplenomegaly or masses,  no abdominal bruits or hernia , no rebound or guarding.  Rectal: ***  Extremities: No lower extremity edema. No clubbing or deformities. Neuro: Alert and oriented x 4   Skin: Warm and dry, no jaundice.   Psych: Alert and cooperative, normal mood and affect.  Labs   *** Imaging Studies   No results found.  Assessment       PLAN   ***   Laureen Ochs. Bobby Rumpf, [place], [facility]

## 2022-03-28 ENCOUNTER — Encounter: Payer: Self-pay | Admitting: Gastroenterology

## 2022-03-28 ENCOUNTER — Ambulatory Visit: Payer: Medicare HMO | Admitting: Gastroenterology

## 2022-03-28 VITALS — BP 119/75 | HR 54 | Temp 98.3°F | Ht 67.0 in | Wt 178.6 lb

## 2022-03-28 DIAGNOSIS — R1319 Other dysphagia: Secondary | ICD-10-CM

## 2022-03-28 DIAGNOSIS — K2100 Gastro-esophageal reflux disease with esophagitis, without bleeding: Secondary | ICD-10-CM | POA: Diagnosis not present

## 2022-03-28 DIAGNOSIS — K219 Gastro-esophageal reflux disease without esophagitis: Secondary | ICD-10-CM | POA: Insufficient documentation

## 2022-03-28 DIAGNOSIS — K5900 Constipation, unspecified: Secondary | ICD-10-CM | POA: Diagnosis not present

## 2022-05-22 ENCOUNTER — Other Ambulatory Visit (HOSPITAL_COMMUNITY): Payer: Self-pay | Admitting: Family Medicine

## 2022-05-22 DIAGNOSIS — Z13820 Encounter for screening for osteoporosis: Secondary | ICD-10-CM

## 2022-08-30 ENCOUNTER — Other Ambulatory Visit (HOSPITAL_COMMUNITY): Payer: Self-pay | Admitting: Nurse Practitioner

## 2022-08-30 DIAGNOSIS — Z13820 Encounter for screening for osteoporosis: Secondary | ICD-10-CM

## 2022-10-19 ENCOUNTER — Ambulatory Visit (HOSPITAL_COMMUNITY)
Admission: RE | Admit: 2022-10-19 | Discharge: 2022-10-19 | Disposition: A | Discharge status: Home / Self Care | Payer: Medicare HMO | Source: Ambulatory Visit | Attending: Nurse Practitioner | Admitting: Nurse Practitioner

## 2022-10-19 DIAGNOSIS — Z13820 Encounter for screening for osteoporosis: Secondary | ICD-10-CM | POA: Insufficient documentation

## 2022-10-19 DIAGNOSIS — Z780 Asymptomatic menopausal state: Secondary | ICD-10-CM | POA: Diagnosis not present

## 2022-10-19 DIAGNOSIS — M85852 Other specified disorders of bone density and structure, left thigh: Secondary | ICD-10-CM | POA: Insufficient documentation

## 2023-03-04 ENCOUNTER — Other Ambulatory Visit: Payer: Self-pay | Admitting: Internal Medicine
